# Patient Record
Sex: Female | Born: 1980 | Race: Black or African American | Hispanic: No | Marital: Single | Smoking: Never smoker
Health system: Southern US, Community
[De-identification: ages and names within clinical notes are randomized; demographics above are authoritative.]

---

## 2006-12-06 ENCOUNTER — Other Ambulatory Visit: Admission: RE | Admit: 2006-12-06 | Discharge: 2006-12-06 | Payer: Self-pay | Admitting: Obstetrics and Gynecology

## 2007-01-29 ENCOUNTER — Ambulatory Visit (HOSPITAL_COMMUNITY): Admission: RE | Admit: 2007-01-29 | Discharge: 2007-01-29 | Payer: Self-pay | Admitting: Obstetrics and Gynecology

## 2007-04-29 ENCOUNTER — Inpatient Hospital Stay (HOSPITAL_COMMUNITY): Admission: AD | Admit: 2007-04-29 | Discharge: 2007-04-30 | Payer: Self-pay | Admitting: Obstetrics and Gynecology

## 2010-02-25 ENCOUNTER — Ambulatory Visit (HOSPITAL_COMMUNITY): Admission: RE | Admit: 2010-02-25 | Discharge: 2010-02-25 | Payer: Self-pay | Admitting: Obstetrics and Gynecology

## 2010-07-07 ENCOUNTER — Inpatient Hospital Stay (HOSPITAL_COMMUNITY): Admission: RE | Admit: 2010-07-07 | Discharge: 2010-07-10 | Payer: Self-pay | Admitting: Obstetrics & Gynecology

## 2011-02-11 LAB — CBC
HCT: 33 % — ABNORMAL LOW (ref 36.0–46.0)
HCT: 35.8 % — ABNORMAL LOW (ref 36.0–46.0)
Hemoglobin: 11.2 g/dL — ABNORMAL LOW (ref 12.0–15.0)
MCH: 30 pg (ref 26.0–34.0)
MCH: 30.3 pg (ref 26.0–34.0)
MCV: 88.8 fL (ref 78.0–100.0)
MCV: 89.2 fL (ref 78.0–100.0)
MCV: 89.4 fL (ref 78.0–100.0)
Platelets: 75 10*3/uL — ABNORMAL LOW (ref 150–400)
RDW: 13.5 % (ref 11.5–15.5)
RDW: 13.6 % (ref 11.5–15.5)
WBC: 5.9 10*3/uL (ref 4.0–10.5)
WBC: 6.6 10*3/uL (ref 4.0–10.5)

## 2011-02-11 LAB — CCBB MATERNAL DONOR DRAW

## 2011-02-11 LAB — SURGICAL PCR SCREEN: Staphylococcus aureus: NEGATIVE

## 2011-02-11 LAB — RPR: RPR Ser Ql: NONREACTIVE

## 2011-09-15 LAB — URINALYSIS, ROUTINE W REFLEX MICROSCOPIC
Specific Gravity, Urine: 1.01
pH: 6

## 2012-04-18 ENCOUNTER — Emergency Department (HOSPITAL_COMMUNITY)
Admission: EM | Admit: 2012-04-18 | Discharge: 2012-04-18 | Disposition: A | Discharge status: Home / Self Care | Payer: No Typology Code available for payment source | Attending: Emergency Medicine | Admitting: Emergency Medicine

## 2012-04-18 ENCOUNTER — Encounter (HOSPITAL_COMMUNITY): Payer: Self-pay | Admitting: *Deleted

## 2012-04-18 DIAGNOSIS — M549 Dorsalgia, unspecified: Secondary | ICD-10-CM | POA: Insufficient documentation
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
## 2012-04-18 NOTE — ED Notes (Signed)
Pt reports that she need to leave because she must be home by 1230 for kids. Pt leaving ama.

## 2012-04-19 ENCOUNTER — Emergency Department (HOSPITAL_COMMUNITY)
Admission: EM | Admit: 2012-04-19 | Discharge: 2012-04-19 | Disposition: A | Discharge status: Home Health | Payer: No Typology Code available for payment source | Attending: Emergency Medicine | Admitting: Emergency Medicine

## 2012-04-19 ENCOUNTER — Encounter (HOSPITAL_COMMUNITY): Payer: Self-pay

## 2012-04-19 DIAGNOSIS — M545 Low back pain, unspecified: Secondary | ICD-10-CM | POA: Insufficient documentation

## 2012-04-19 DIAGNOSIS — S39012A Strain of muscle, fascia and tendon of lower back, initial encounter: Secondary | ICD-10-CM

## 2012-04-19 DIAGNOSIS — S335XXA Sprain of ligaments of lumbar spine, initial encounter: Secondary | ICD-10-CM | POA: Insufficient documentation

## 2012-04-19 MED ORDER — TRAMADOL HCL 50 MG PO TABS: 50.0000 mg | ORAL_TABLET | Freq: Four times a day (QID) | ORAL | Status: AC | PRN

## 2012-04-19 MED ORDER — NAPROXEN 500 MG PO TABS: 500.0000 mg | ORAL_TABLET | Freq: Two times a day (BID) | ORAL | Status: AC

## 2012-04-19 MED ORDER — IBUPROFEN 800 MG PO TABS
800.0000 mg | ORAL_TABLET | Freq: Once | ORAL | Status: AC
  Administered 2012-04-19: 800 mg via ORAL
  Filled 2012-04-19: qty 1
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
## 2012-04-19 NOTE — Discharge Instructions (Signed)
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
## 2015-12-24 ENCOUNTER — Ambulatory Visit: Payer: Medicaid Other | Admitting: Family Medicine

## 2016-01-06 ENCOUNTER — Ambulatory Visit: Payer: Medicaid Other | Attending: Family Medicine | Admitting: Family Medicine

## 2016-01-06 ENCOUNTER — Encounter: Payer: Self-pay | Admitting: Family Medicine

## 2016-01-06 VITALS — BP 116/83 | HR 66 | Temp 98.3°F | Resp 14 | Ht 61.0 in | Wt 122.8 lb

## 2016-01-06 DIAGNOSIS — Z0000 Encounter for general adult medical examination without abnormal findings: Secondary | ICD-10-CM | POA: Diagnosis not present

## 2016-01-06 DIAGNOSIS — K0889 Other specified disorders of teeth and supporting structures: Secondary | ICD-10-CM | POA: Diagnosis not present

## 2016-01-06 DIAGNOSIS — Z131 Encounter for screening for diabetes mellitus: Secondary | ICD-10-CM | POA: Diagnosis not present

## 2016-01-06 DIAGNOSIS — Z79899 Other long term (current) drug therapy: Secondary | ICD-10-CM | POA: Insufficient documentation

## 2016-01-06 DIAGNOSIS — E118 Type 2 diabetes mellitus with unspecified complications: Secondary | ICD-10-CM | POA: Insufficient documentation

## 2016-01-06 LAB — POCT GLYCOSYLATED HEMOGLOBIN (HGB A1C): Hemoglobin A1C: 5.4

## 2016-01-06 MED ORDER — OXYCODONE-ACETAMINOPHEN 5-325 MG PO TABS: 1.0000 | ORAL_TABLET | Freq: Three times a day (TID) | ORAL | Status: DC | PRN
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
## 2016-01-06 NOTE — Progress Notes (Signed)
Patient here to establish care She is currently having her menstrual cycle Under care of her dentist for tooth infection

## 2016-05-23 ENCOUNTER — Other Ambulatory Visit (HOSPITAL_COMMUNITY)
Admission: RE | Admit: 2016-05-23 | Discharge: 2016-05-23 | Disposition: A | Discharge status: Home / Self Care | Payer: Medicaid Other | Source: Ambulatory Visit | Attending: Family Medicine | Admitting: Family Medicine

## 2016-05-23 ENCOUNTER — Ambulatory Visit: Payer: Medicaid Other | Attending: Family Medicine | Admitting: Family Medicine

## 2016-05-23 ENCOUNTER — Encounter: Payer: Self-pay | Admitting: Family Medicine

## 2016-05-23 VITALS — BP 111/74 | HR 72 | Temp 98.9°F | Resp 14 | Ht 65.0 in | Wt 115.6 lb

## 2016-05-23 DIAGNOSIS — M546 Pain in thoracic spine: Secondary | ICD-10-CM | POA: Insufficient documentation

## 2016-05-23 DIAGNOSIS — Z13228 Encounter for screening for other metabolic disorders: Secondary | ICD-10-CM | POA: Diagnosis not present

## 2016-05-23 DIAGNOSIS — Z01411 Encounter for gynecological examination (general) (routine) with abnormal findings: Secondary | ICD-10-CM | POA: Insufficient documentation

## 2016-05-23 DIAGNOSIS — Z0000 Encounter for general adult medical examination without abnormal findings: Secondary | ICD-10-CM | POA: Insufficient documentation

## 2016-05-23 DIAGNOSIS — Z113 Encounter for screening for infections with a predominantly sexual mode of transmission: Secondary | ICD-10-CM | POA: Insufficient documentation

## 2016-05-23 DIAGNOSIS — Z124 Encounter for screening for malignant neoplasm of cervix: Secondary | ICD-10-CM | POA: Diagnosis not present

## 2016-05-23 DIAGNOSIS — Z79899 Other long term (current) drug therapy: Secondary | ICD-10-CM | POA: Diagnosis not present

## 2016-05-23 DIAGNOSIS — Z1151 Encounter for screening for human papillomavirus (HPV): Secondary | ICD-10-CM | POA: Insufficient documentation

## 2016-05-23 DIAGNOSIS — Z23 Encounter for immunization: Secondary | ICD-10-CM | POA: Diagnosis not present

## 2016-05-23 LAB — HEMOGLOBIN A1C
HEMOGLOBIN A1C: 5.6 % (ref <5.7)
MEAN PLASMA GLUCOSE: 114 mg/dL

## 2016-05-23 LAB — COMPLETE METABOLIC PANEL WITH GFR
ALBUMIN: 4.2 g/dL (ref 3.6–5.1)
ALK PHOS: 38 U/L (ref 33–115)
ALT: 9 U/L (ref 6–29)
AST: 14 U/L (ref 10–30)
BUN: 6 mg/dL — ABNORMAL LOW (ref 7–25)
CALCIUM: 8.8 mg/dL (ref 8.6–10.2)
CO2: 24 mmol/L (ref 20–31)
Chloride: 104 mmol/L (ref 98–110)
Creat: 0.59 mg/dL (ref 0.50–1.10)
Glucose, Bld: 83 mg/dL (ref 65–99)
POTASSIUM: 3.6 mmol/L (ref 3.5–5.3)
Sodium: 136 mmol/L (ref 135–146)
Total Bilirubin: 0.5 mg/dL (ref 0.2–1.2)
Total Protein: 6.9 g/dL (ref 6.1–8.1)

## 2016-05-23 LAB — LIPID PANEL
CHOL/HDL RATIO: 2.2 ratio (ref <=5.0)
CHOLESTEROL: 242 mg/dL — AB (ref 125–200)
HDL: 110 mg/dL (ref >=46)
LDL Cholesterol: 121 mg/dL (ref <130)
Triglycerides: 53 mg/dL (ref <150)
VLDL: 11 mg/dL (ref <30)

## 2016-05-23 NOTE — Progress Notes (Signed)
Pt here for blood work and physical. Pt does not take any medications.

## 2016-05-23 NOTE — Progress Notes (Signed)
Subjective:  Patient ID: Amy Ruiz, female    DOB: 05/26/1981  Age: 35 y.o. MRN: [id number]  CC: Annual Exam   HPI Amy Ruiz presents for a complete physical exam. She has mild thoracic back pain after she took a fall 6 weeks ago and is not taking anything for it. Pain does not radiate and she has been advised to apply heat and take OTC Ibuprofen.  She lost her dad 2 months ago and is also stressed due to the fact that her son is receiving chemotherapy.  Outpatient Prescriptions Prior to Visit  Medication Sig Dispense Refill  . amoxicillin (AMOXIL) 500 MG capsule Take 500 mg by mouth 2 (two) times daily. Reported on 05/23/2016    . Cyanocobalamin (VITAMIN B 12 PO) Take 1 tablet by mouth daily. Reported on 05/23/2016    . oxyCODONE-acetaminophen (ROXICET) 5-325 MG tablet Take 1 tablet by mouth every 8 (eight) hours as needed for severe pain. (Patient not taking: Reported on 05/23/2016) 20 tablet 0   No facility-administered medications prior to visit.    ROS Review of Systems  Constitutional: Negative for activity change, appetite change and fatigue.  HENT: Negative for congestion, sinus pressure and sore throat.   Eyes: Negative for visual disturbance.  Respiratory: Negative for cough, chest tightness, shortness of breath and wheezing.   Cardiovascular: Negative for chest pain and palpitations.  Gastrointestinal: Negative for abdominal pain, constipation and abdominal distention.  Endocrine: Negative for polydipsia.  Genitourinary: Negative for dysuria and frequency.  Musculoskeletal: Negative for back pain and arthralgias.  Skin: Negative for rash.  Neurological: Negative for tremors, light-headedness and numbness.  Hematological: Does not bruise/bleed easily.  Psychiatric/Behavioral: Negative for behavioral problems and agitation.    Objective:  BP 111/74 mmHg  Pulse 72  Temp(Src) 98.9 F (37.2 C) (Oral)  Resp 14  Ht 5\' 5"  (1.651 m)  Wt 115 lb 9.6 oz (52.436  kg)  BMI 19.24 kg/m2  SpO2 100%  BP/Weight 05/23/2016 01/06/2016 04/19/2012  Systolic BP 111 116 104  Diastolic BP 74 83 57  Wt. (Lbs) 115.6 122.8 -  BMI 19.24 23.21 -      Physical Exam  Constitutional: She is oriented to person, place, and time. She appears well-developed and well-nourished.  Cardiovascular: Normal rate, normal heart sounds and intact distal pulses.   No murmur heard. Pulmonary/Chest: Effort normal and breath sounds normal. She has no wheezes. She has no rales. She exhibits no tenderness. Right breast exhibits no mass (nipple ring present) and no tenderness. Left breast exhibits no mass (nipple ring present) and no tenderness.  Abdominal: Soft. Bowel sounds are normal. She exhibits no distension and no mass. There is no tenderness.  Genitourinary: Vagina normal and uterus normal.  Musculoskeletal: Normal range of motion. She exhibits tenderness (mild tenderness of palpation of mid back ).  Neurological: She is alert and oriented to person, place, and time.  Psychiatric: She has a normal mood and affect.     Assessment & Plan:   1. Screening for metabolic disorder _Lipid panel - COMPLETE METABOLIC PANEL WITH GFR - HgB [id number]  2. Screening for cervical cancer - Cytology - PAP ([place])  3. Screening for STDs (sexually transmitted diseases) - HIV antibody  4. Need for Tdap vaccination - Tdap vaccine greater than or equal to 7yo IM  5. Routine general medical examination at a health care facility  No orders of the defined types were placed in this encounter.    Follow-up: Return in  1 year (on 05/23/2017) for complete physical exam.   Amy ShaggyEnobong Amao MD

## 2016-05-23 NOTE — Patient Instructions (Addendum)
Health Maintenance, Female Adopting a healthy lifestyle and getting preventive care can go a long way to promote health and wellness. Talk with your health care provider about what schedule of regular examinations is right for you. This is a good chance for you to check in with your provider about disease prevention and staying healthy. In between checkups, there are plenty of things you can do on your own. Experts have done a lot of research about which lifestyle changes and preventive measures are most likely to keep you healthy. Ask your health care provider for more information. WEIGHT AND DIET  Eat a healthy diet  Be sure to include plenty of vegetables, fruits, low-fat dairy products, and lean protein.  Do not eat a lot of foods high in solid fats, added sugars, or salt.  Get regular exercise. This is one of the most important things you can do for your health.  Most adults should exercise for at least 150 minutes each week. The exercise should increase your heart rate and make you sweat (moderate-intensity exercise).  Most adults should also do strengthening exercises at least twice a week. This is in addition to the moderate-intensity exercise.  Maintain a healthy weight  Body mass index (BMI) is a measurement that can be used to identify possible weight problems. It estimates body fat based on height and weight. Your health care provider can help determine your BMI and help you achieve or maintain a healthy weight.  For females 28 years of age and older:   A BMI below 18.5 is considered underweight.  A BMI of 18.5 to 24.9 is normal.  A BMI of 25 to 29.9 is considered overweight.  A BMI of 30 and above is considered obese.  Watch levels of cholesterol and blood lipids  You should start having your blood tested for lipids and cholesterol at 36 years of age, then have this test every 5 years.  You may need to have your cholesterol levels checked more often if:  Your lipid  or cholesterol levels are high.  You are older than 36 years of age.  You are at high risk for heart disease.  CANCER SCREENING   Lung Cancer  Lung cancer screening is recommended for adults 75-66 years old who are at high risk for lung cancer because of a history of smoking.  A yearly low-dose CT scan of the lungs is recommended for people who:  Currently smoke.  Have quit within the past 15 years.  Have at least a 30-pack-year history of smoking. A pack year is smoking an average of one pack of cigarettes a day for 1 year.  Yearly screening should continue until it has been 15 years since you quit.  Yearly screening should stop if you develop a health problem that would prevent you from having lung cancer treatment.  Breast Cancer  Practice breast self-awareness. This means understanding how your breasts normally appear and feel.  It also means doing regular breast self-exams. Let your health care provider know about any changes, no matter how small.  If you are in your 20s or 30s, you should have a clinical breast exam (CBE) by a health care provider every 1-3 years as part of a regular health exam.  If you are 25 or older, have a CBE every year. Also consider having a breast X-ray (mammogram) every year.  If you have a family history of breast cancer, talk to your health care provider about genetic screening.  If you  are at high risk for breast cancer, talk to your health care provider about having an MRI and a mammogram every year.  Breast cancer gene (BRCA) assessment is recommended for women who have family members with BRCA-related cancers. BRCA-related cancers include:  Breast.  Ovarian.  Tubal.  Peritoneal cancers.  Results of the assessment will determine the need for genetic counseling and BRCA1 and BRCA2 testing. Cervical Cancer Your health care provider may recommend that you be screened regularly for cancer of the pelvic organs (ovaries, uterus, and  vagina). This screening involves a pelvic examination, including checking for microscopic changes to the surface of your cervix (Pap test). You may be encouraged to have this screening done every 3 years, beginning at age 21.  For women ages 30-65, health care providers may recommend pelvic exams and Pap testing every 3 years, or they may recommend the Pap and pelvic exam, combined with testing for human papilloma virus (HPV), every 5 years. Some types of HPV increase your risk of cervical cancer. Testing for HPV may also be done on women of any age with unclear Pap test results.  Other health care providers may not recommend any screening for nonpregnant women who are considered low risk for pelvic cancer and who do not have symptoms. Ask your health care provider if a screening pelvic exam is right for you.  If you have had past treatment for cervical cancer or a condition that could lead to cancer, you need Pap tests and screening for cancer for at least 20 years after your treatment. If Pap tests have been discontinued, your risk factors (such as having a new sexual partner) need to be reassessed to determine if screening should resume. Some women have medical problems that increase the chance of getting cervical cancer. In these cases, your health care provider may recommend more frequent screening and Pap tests. Colorectal Cancer  This type of cancer can be detected and often prevented.  Routine colorectal cancer screening usually begins at 36 years of age and continues through 36 years of age.  Your health care provider may recommend screening at an earlier age if you have risk factors for colon cancer.  Your health care provider may also recommend using home test kits to check for hidden blood in the stool.  A small camera at the end of a tube can be used to examine your colon directly (sigmoidoscopy or colonoscopy). This is done to check for the earliest forms of colorectal  cancer.  Routine screening usually begins at age 50.  Direct examination of the colon should be repeated every 5-10 years through 36 years of age. However, you may need to be screened more often if early forms of precancerous polyps or small growths are found. Skin Cancer  Check your skin from head to toe regularly.  Tell your health care provider about any new moles or changes in moles, especially if there is a change in a mole's shape or color.  Also tell your health care provider if you have a mole that is larger than the size of a pencil eraser.  Always use sunscreen. Apply sunscreen liberally and repeatedly throughout the day.  Protect yourself by wearing long sleeves, pants, a wide-brimmed hat, and sunglasses whenever you are outside. HEART DISEASE, DIABETES, AND HIGH BLOOD PRESSURE   High blood pressure causes heart disease and increases the risk of stroke. High blood pressure is more likely to develop in:  People who have blood pressure in the high end   of the normal range (130-139/85-89 mm Hg).  People who are overweight or obese.  People who are African American.  If you are 38-23 years of age, have your blood pressure checked every 3-5 years. If you are 61 years of age or older, have your blood pressure checked every year. You should have your blood pressure measured twice--once when you are at a hospital or clinic, and once when you are not at a hospital or clinic. Record the average of the two measurements. To check your blood pressure when you are not at a hospital or clinic, you can use:  An automated blood pressure machine at a pharmacy.  A home blood pressure monitor.  If you are between 45 years and 39 years old, ask your health care provider if you should take aspirin to prevent strokes.  Have regular diabetes screenings. This involves taking a blood sample to check your fasting blood sugar level.  If you are at a normal weight and have a low risk for diabetes,  have this test once every three years after 36 years of age.  If you are overweight and have a high risk for diabetes, consider being tested at a younger age or more often. PREVENTING INFECTION  Hepatitis B  If you have a higher risk for hepatitis B, you should be screened for this virus. You are considered at high risk for hepatitis B if:  You were born in a country where hepatitis B is common. Ask your health care provider which countries are considered high risk.  Your parents were born in a high-risk country, and you have not been immunized against hepatitis B (hepatitis B vaccine).  You have HIV or AIDS.  You use needles to inject street drugs.  You live with someone who has hepatitis B.  You have had sex with someone who has hepatitis B.  You get hemodialysis treatment.  You take certain medicines for conditions, including cancer, organ transplantation, and autoimmune conditions. Hepatitis C  Blood testing is recommended for:  Everyone born from [birth year] through 1966.  Anyone with known risk factors for hepatitis C. Sexually transmitted infections (STIs)  You should be screened for sexually transmitted infections (STIs) including gonorrhea and chlamydia if:  You are sexually active and are younger than 36 years of age.  You are older than 36 years of age and your health care provider tells you that you are at risk for this type of infection.  Your sexual activity has changed since you were last screened and you are at an increased risk for chlamydia or gonorrhea. Ask your health care provider if you are at risk.  If you do not have HIV, but are at risk, it may be recommended that you take a prescription medicine daily to prevent HIV infection. This is called pre-exposure prophylaxis (PrEP). You are considered at risk if:  You are sexually active and do not regularly use condoms or know the HIV status of your partner(s).  You take drugs by injection.  You are sexually  active with a partner who has HIV. Talk with your health care provider about whether you are at high risk of being infected with HIV. If you choose to begin PrEP, you should first be tested for HIV. You should then be tested every 3 months for as long as you are taking PrEP.  PREGNANCY   If you are premenopausal and you may become pregnant, ask your health care provider about preconception counseling.  If you may  become pregnant, take 400 to 800 micrograms (mcg) of folic acid every day.  If you want to prevent pregnancy, talk to your health care provider about birth control (contraception). OSTEOPOROSIS AND MENOPAUSE   Osteoporosis is a disease in which the bones lose minerals and strength with aging. This can result in serious bone fractures. Your risk for osteoporosis can be identified using a bone density scan.  If you are 65 years of age or older, or if you are at risk for osteoporosis and fractures, ask your health care provider if you should be screened.  Ask your health care provider whether you should take a calcium or vitamin D supplement to lower your risk for osteoporosis.  Menopause may have certain physical symptoms and risks.  Hormone replacement therapy may reduce some of these symptoms and risks. Talk to your health care provider about whether hormone replacement therapy is right for you.  HOME CARE INSTRUCTIONS   Schedule regular health, dental, and eye exams.  Stay current with your immunizations.   Do not use any tobacco products including cigarettes, chewing tobacco, or electronic cigarettes.  If you are pregnant, do not drink alcohol.  If you are breastfeeding, limit how much and how often you drink alcohol.  Limit alcohol intake to no more than 1 drink per day for nonpregnant women. One drink equals 12 ounces of beer, 5 ounces of wine, or 1 ounces of hard liquor.  Do not use street drugs.  Do not share needles.  Ask your health care provider for help if  you need support or information about quitting drugs.  Tell your health care provider if you often feel depressed.  Tell your health care provider if you have ever been abused or do not feel safe at home.   This information is not intended to replace advice given to you by your health care provider. Make sure you discuss any questions you have with your health care provider.   Document Released: 05/30/2011 Document Revised: 12/05/2014 Document Reviewed: 10/16/2013 Elsevier Interactive Patient Education 2016 [employer] Tdap Vaccine (Tetanus, Diphtheria and Pertussis): What You Need to Know 1. Why get vaccinated? Tetanus, diphtheria and pertussis are very serious diseases. Tdap vaccine can protect us from these diseases. And, Tdap vaccine given to pregnant women can protect newborn babies against pertussis. TETANUS (Lockjaw) is rare in the [place] today. It causes painful muscle tightening and stiffness, usually all over the body.  It can lead to tightening of muscles in the head and neck so you can't open your mouth, swallow, or sometimes even breathe. Tetanus kills about 1 out of 10 people who are infected even after receiving the best medical care. DIPHTHERIA is also rare in the [place] today. It can cause a thick coating to form in the back of the throat.  It can lead to breathing problems, heart failure, paralysis, and death. PERTUSSIS (Whooping Cough) causes severe coughing spells, which can cause difficulty breathing, vomiting and disturbed sleep.  It can also lead to weight loss, incontinence, and rib fractures. Up to 2 in 100 adolescents and 5 in 100 adults with pertussis are hospitalized or have complications, which could include pneumonia or death. These diseases are caused by bacteria. Diphtheria and pertussis are spread from person to person through secretions from coughing or sneezing. Tetanus enters the body through cuts, scratches, or wounds. Before vaccines, as  many as 200,000 cases of diphtheria, 200,000 cases of pertussis, and hundreds of cases of tetanus, were reported in   the [place] each year. Since vaccination began, reports of cases for tetanus and diphtheria have dropped by about 99% and for pertussis by about 80%. 2. Tdap vaccine Tdap vaccine can protect adolescents and adults from tetanus, diphtheria, and pertussis. One dose of Tdap is routinely given at age 11 or 12. People who did not get Tdap at that age should get it as soon as possible. Tdap is especially important for healthcare professionals and anyone having close contact with a baby younger than 12 months. Pregnant women should get a dose of Tdap during every pregnancy, to protect the newborn from pertussis. Infants are most at risk for severe, life-threatening complications from pertussis. Another vaccine, called Td, protects against tetanus and diphtheria, but not pertussis. A Td booster should be given every 10 years. Tdap may be given as one of these boosters if you have never gotten Tdap before. Tdap may also be given after a severe cut or burn to prevent tetanus infection. Your doctor or the person giving you the vaccine can give you more information. Tdap may safely be given at the same time as other vaccines. 3. Some people should not get this vaccine  A person who has ever had a life-threatening allergic reaction after a previous dose of any diphtheria, tetanus or pertussis containing vaccine, OR has a severe allergy to any part of this vaccine, should not get Tdap vaccine. Tell the person giving the vaccine about any severe allergies.  Anyone who had coma or long repeated seizures within 7 days after a childhood dose of DTP or DTaP, or a previous dose of Tdap, should not get Tdap, unless a cause other than the vaccine was found. They can still get Td.  Talk to your doctor if you:  have seizures or another nervous system problem,  had severe pain or swelling after any  vaccine containing diphtheria, tetanus or pertussis,  ever had a condition called Guillain-Barr Syndrome (GBS),  aren't feeling well on the day the shot is scheduled. 4. Risks With any medicine, including vaccines, there is a chance of side effects. These are usually mild and go away on their own. Serious reactions are also possible but are rare. Most people who get Tdap vaccine do not have any problems with it. Mild problems following Tdap (Did not interfere with activities)  Pain where the shot was given (about 3 in 4 adolescents or 2 in 3 adults)  Redness or swelling where the shot was given (about 1 person in 5)  Mild fever of at least 100.4F (up to about 1 in 25 adolescents or 1 in 100 adults)  Headache (about 3 or 4 people in 10)  Tiredness (about 1 person in 3 or 4)  Nausea, vomiting, diarrhea, stomach ache (up to 1 in 4 adolescents or 1 in 10 adults)  Chills, sore joints (about 1 person in 10)  Body aches (about 1 person in 3 or 4)  Rash, swollen glands (uncommon) Moderate problems following Tdap (Interfered with activities, but did not require medical attention)  Pain where the shot was given (up to 1 in 5 or 6)  Redness or swelling where the shot was given (up to about 1 in 16 adolescents or 1 in 12 adults)  Fever over 102F (about 1 in 100 adolescents or 1 in 250 adults)  Headache (about 1 in 7 adolescents or 1 in 10 adults)  Nausea, vomiting, diarrhea, stomach ache (up to 1 or 3 people in 100)  Swelling of the   entire arm where the shot was given (up to about 1 in 500). Severe problems following Tdap (Unable to perform usual activities; required medical attention)  Swelling, severe pain, bleeding and redness in the arm where the shot was given (rare). Problems that could happen after any vaccine:  People sometimes faint after a medical procedure, including vaccination. Sitting or lying down for about 15 minutes can help prevent fainting, and injuries  caused by a fall. Tell your doctor if you feel dizzy, or have vision changes or ringing in the ears.  Some people get severe pain in the shoulder and have difficulty moving the arm where a shot was given. This happens very rarely.  Any medication can cause a severe allergic reaction. Such reactions from a vaccine are very rare, estimated at fewer than 1 in a million doses, and would happen within a few minutes to a few hours after the vaccination. As with any medicine, there is a very remote chance of a vaccine causing a serious injury or death. The safety of vaccines is always being monitored. For more information, visit: www.cdc.gov/vaccinesafety/ 5. What if there is a serious problem? What should I look for?  Look for anything that concerns you, such as signs of a severe allergic reaction, very high fever, or unusual behavior.  Signs of a severe allergic reaction can include hives, swelling of the face and throat, difficulty breathing, a fast heartbeat, dizziness, and weakness. These would usually start a few minutes to a few hours after the vaccination. What should I do?  If you think it is a severe allergic reaction or other emergency that can't wait, call 9-1-1 or get the person to the nearest hospital. Otherwise, call your doctor.  Afterward, the reaction should be reported to the Vaccine Adverse Event Reporting System (VAERS). Your doctor might file this report, or you can do it yourself through the VAERS web site at www.vaers.hhs.gov, or by calling 1-800-822-7967. VAERS does not give medical advice.  6. The National Vaccine Injury Compensation Program The National Vaccine Injury Compensation Program (VICP) is a federal program that was created to compensate people who may have been injured by certain vaccines. Persons who believe they may have been injured by a vaccine can learn about the program and about filing a claim by calling 1-800-338-2382 or visiting the VICP website at  www.hrsa.gov/vaccinecompensation. There is a time limit to file a claim for compensation. 7. How can I learn more?  Ask your doctor. He or she can give you the vaccine package insert or suggest other sources of information.  Call your local or state health department.  Contact the Centers for Disease Control and Prevention (CDC):  Call 1-800-232-4636 (1-800-CDC-INFO) or  Visit CDC's website at www.cdc.gov/vaccines CDC Tdap Vaccine VIS (01/21/14)   This information is not intended to replace advice given to you by your health care provider. Make sure you discuss any questions you have with your health care provider.   Document Released: 05/15/2012 Document Revised: 12/05/2014 Document Reviewed: 02/26/2014 Elsevier Interactive Patient Education 2016 Elsevier Inc.  

## 2016-05-24 LAB — CYTOLOGY - PAP

## 2016-05-24 LAB — CERVICOVAGINAL ANCILLARY ONLY: WET PREP (BD AFFIRM): POSITIVE — AB

## 2016-05-24 LAB — HIV ANTIBODY (ROUTINE TESTING W REFLEX): HIV: NONREACTIVE

## 2016-05-25 ENCOUNTER — Telehealth: Payer: Self-pay

## 2016-05-25 ENCOUNTER — Other Ambulatory Visit: Payer: Self-pay | Admitting: Family Medicine

## 2016-05-25 DIAGNOSIS — B3731 Acute candidiasis of vulva and vagina: Secondary | ICD-10-CM

## 2016-05-25 DIAGNOSIS — B373 Candidiasis of vulva and vagina: Secondary | ICD-10-CM

## 2016-05-25 DIAGNOSIS — B9689 Other specified bacterial agents as the cause of diseases classified elsewhere: Secondary | ICD-10-CM

## 2016-05-25 DIAGNOSIS — N760 Acute vaginitis: Secondary | ICD-10-CM

## 2016-05-25 MED ORDER — METRONIDAZOLE 0.75 % VA GEL: 1.0000 | Freq: Every day | VAGINAL | Status: DC

## 2016-05-25 MED ORDER — FLUCONAZOLE 150 MG PO TABS: 150.0000 mg | ORAL_TABLET | Freq: Once | ORAL | Status: DC

## 2016-05-25 NOTE — Telephone Encounter (Signed)
-----   Message from Jaclyn ShaggyEnobong Amao, MD sent at 05/25/2016  1:50 PM EDT ----- Labs negative for HIV, diabetes, cholesterol is mildly elevated and she has been advised to work on low-cholesterol diet and exercise. Vaginal cultures revealed bacterial vaginosis and Candida which are not STDs- I had prescribed MetroGel and Diflucan but was unable to send electronically due to inactivity pharmacy on file. Please obtain her pharmacy and sent prescriptions. Pap smear showed ASCUS, HPV negative will repeat in one year.

## 2016-05-25 NOTE — Telephone Encounter (Signed)
Called patient today and explained her lab results.  Patient stated understanding and provided a pharmacy which writer entered into the chart.  Both prescriptions have been faxed to the [facility] pharmacy.  Patient to pick up prescriptions today and begin medications .

## 2016-05-25 NOTE — Telephone Encounter (Signed)
-----   Message from Enobong Amao, MD sent at 05/25/2016  1:50 PM EDT ----- Labs negative for HIV, diabetes, cholesterol is mildly elevated and she has been advised to work on low-cholesterol diet and exercise. Vaginal cultures revealed bacterial vaginosis and Candida which are not STDs- I had prescribed MetroGel and Diflucan but was unable to send electronically due to inactivity pharmacy on file. Please obtain her pharmacy and sent prescriptions. Pap smear showed ASCUS, HPV negative will repeat in one year. 

## 2016-06-12 ENCOUNTER — Encounter (HOSPITAL_COMMUNITY): Payer: Self-pay | Admitting: Oncology

## 2016-06-12 ENCOUNTER — Emergency Department (HOSPITAL_COMMUNITY)
Admission: EM | Admit: 2016-06-12 | Discharge: 2016-06-12 | Disposition: A | Discharge status: Home / Self Care | Payer: Medicaid Other | Attending: Emergency Medicine | Admitting: Emergency Medicine

## 2016-06-12 DIAGNOSIS — H9391 Unspecified disorder of right ear: Secondary | ICD-10-CM | POA: Insufficient documentation

## 2016-06-12 DIAGNOSIS — Y929 Unspecified place or not applicable: Secondary | ICD-10-CM | POA: Diagnosis not present

## 2016-06-12 DIAGNOSIS — Y999 Unspecified external cause status: Secondary | ICD-10-CM | POA: Insufficient documentation

## 2016-06-12 DIAGNOSIS — Y939 Activity, unspecified: Secondary | ICD-10-CM | POA: Diagnosis not present

## 2016-06-12 DIAGNOSIS — X58XXXA Exposure to other specified factors, initial encounter: Secondary | ICD-10-CM | POA: Insufficient documentation

## 2016-06-12 DIAGNOSIS — T161XXA Foreign body in right ear, initial encounter: Secondary | ICD-10-CM | POA: Diagnosis present

## 2016-06-12 DIAGNOSIS — H9201 Otalgia, right ear: Secondary | ICD-10-CM

## 2016-06-12 MED ORDER — ANTIPYRINE-BENZOCAINE 5.4-1.4 % OT SOLN
3.0000 [drp] | OTIC | Status: DC | PRN
Start: 2016-06-12 — End: 2017-02-17

## 2016-06-12 NOTE — ED Notes (Signed)
Pt was cleaning her right ear w/ a q tip when the cotton from the q-tip became lodged into pt's right ear.  Rates pain 6/10, pressure in nature.

## 2016-06-12 NOTE — Discharge Instructions (Signed)
There were no foreign bodies in your ear canal. Using benzocaine drops as needed for pain. Use naproxen or ibuprofen for any facial pain. Follow-up with your PCP should symptoms fail to resolve by tomorrow.

## 2016-06-12 NOTE — ED Provider Notes (Signed)
CSN: [id number]     Arrival date & time 06/12/16  1955 History  By signing my name below, I, Rosario AdieWilliam Andrew Hiatt, attest that this documentation has been prepared under the direction and in the presence of Moise Friday, PA-C.  Electronically Signed: Rosario AdieWilliam Andrew Hiatt, ED Scribe. 06/12/2016. 8:35 PM.   Chief Complaint  Patient presents with  . Foreign Body in Ear   The history is provided by the patient. No language interpreter was used.   HPI Comments: Amy Ruiz is a 35 y.o. female with no pertinent PMHx who presents to the Emergency Department complaining of sudden onset, unchanged, constant, 6/10 right ear pain s/p cleaning her ear with a q-tip PTA. Pt describes her pain as like a pressure in nature, and states "that it feels clogged up". Per pt, she reports that she was cleaning her ear when the cotton from the q-tip became dislodged in her ear. Her pain/pressure are mildly alleviated when keeps her head pressed to her right shoulder. No history of previous ear infections. She denies hearing loss, drainage, or any other symptoms.  History reviewed. No pertinent past medical history. History reviewed. No pertinent past surgical history. Family History  Problem Relation Age of Onset  . Hypertension Mother    Social History  Substance Use Topics  . Smoking status: Never Smoker   . Smokeless tobacco: None  . Alcohol Use: No   OB History    No data available     Review of Systems  Constitutional: Negative for fever.  HENT: Positive for ear pain (right). Negative for facial swelling and hearing loss.   Neurological: Negative for headaches.   Allergies  Review of patient's allergies indicates no known allergies.  Home Medications   Prior to Admission medications   Medication Sig Start Date End Date Taking? Authorizing Provider  amoxicillin (AMOXIL) 500 MG capsule Take 500 mg by mouth 2 (two) times daily. Reported on 05/23/2016    Historical Provider, MD   antipyrine-benzocaine Lyla Son(AURALGAN) otic solution Place 3-4 drops into the right ear every 2 (two) hours as needed for ear pain. 06/12/16   Lewin Pellow C Jahzara Slattery, PA-C  Cyanocobalamin (VITAMIN B 12 PO) Take 1 tablet by mouth daily. Reported on 05/23/2016    Historical Provider, MD  fluconazole (DIFLUCAN) 150 MG tablet Take 1 tablet (150 mg total) by mouth once. 05/25/16   Jaclyn ShaggyEnobong Amao, MD  metroNIDAZOLE (METROGEL VAGINAL) 0.75 % vaginal gel Place 1 Applicatorful vaginally at bedtime. 05/25/16   Jaclyn ShaggyEnobong Amao, MD  oxyCODONE-acetaminophen (ROXICET) 5-325 MG tablet Take 1 tablet by mouth every 8 (eight) hours as needed for severe pain. Patient not taking: Reported on 05/23/2016 01/06/16   Jaclyn ShaggyEnobong Amao, MD   BP 121/85 mmHg  Pulse 83  Temp(Src) 98.6 F (37 C) (Oral)  Resp 17  SpO2 100%  LMP 06/08/2016 (Approximate)   Physical Exam  Constitutional: She appears well-developed and well-nourished. No distress.  HENT:  Head: Normocephalic and atraumatic.  Right Ear: External ear normal.  Left Ear: External ear normal.  Both TMs were fully visualized, and were normal with clear canals bilaterally. Scant cerumen, but no foreign bodies visualized. Canals and TMs appear to be atraumatic.  Eyes: Conjunctivae are normal.  Neck: Normal range of motion. Neck supple.  Cardiovascular: Normal rate and regular rhythm.   Pulmonary/Chest: Effort normal. No respiratory distress.  Abdominal: She exhibits no distension.  Musculoskeletal: Normal range of motion.  Neurological: She is alert.  Skin: Skin is warm and dry. She is  not diaphoretic.  Psychiatric: She has a normal mood and affect. Her behavior is normal.  Nursing note and vitals reviewed.  ED Course  Procedures (including critical care time)  DIAGNOSTIC STUDIES: Oxygen Saturation is 100% on RA, normal by my interpretation.   COORDINATION OF CARE: 8:35 PM-Discussed next steps with pt including f/u w/ PCP if symptoms persist. Pt verbalized understanding and is  agreeable with the plan.    MDM   Final diagnoses:  Ear discomfort, right   Amy Ruiz present with A complaint of foreign body in her right ear.  No foreign body noted on exam. No damage to the canal or TM. Benzocaine drops and NSAIDs recommended. Ear hygiene discussed. Patient to follow up with PCP should symptoms continue tomorrow. The patient was given further instructions for home care as well as return precautions. Patient voices understanding of these instructions, accepts the plan, and is comfortable with discharge.  I personally performed the services described in this documentation, which was scribed in my presence. The recorded information has been reviewed and is accurate.   Anselm Pancoast, PA-C 06/12/16 2103  Tilden Fossa, MD 06/13/16 1452

## 2016-06-12 NOTE — ED Notes (Signed)
PT DISCHARGED. INSTRUCTIONS AND PRESCRIPTION GIVEN. AAOX4. PT IN NO APPARENT DISTRESS. THE OPPORTUNITY TO ASK QUESTIONS WAS PROVIDED. 

## 2017-02-13 ENCOUNTER — Telehealth: Payer: Self-pay | Admitting: Family Medicine

## 2017-02-13 DIAGNOSIS — N912 Amenorrhea, unspecified: Secondary | ICD-10-CM

## 2017-02-13 NOTE — Telephone Encounter (Signed)
Patient called the office to inform PCP that she is pregnant. [facility]. Please follow up. Pt has Medicaid.   Thank you.

## 2017-02-13 NOTE — Telephone Encounter (Signed)
Referral placed.

## 2017-02-13 NOTE — Telephone Encounter (Signed)
Writer called patient to let her know that the requested referral was placed by MD.  LVM that if she has any questions to call.

## 2017-02-17 ENCOUNTER — Ambulatory Visit (INDEPENDENT_AMBULATORY_CARE_PROVIDER_SITE_OTHER): Payer: Medicaid Other

## 2017-02-17 ENCOUNTER — Encounter: Payer: Self-pay | Admitting: General Practice

## 2017-02-17 DIAGNOSIS — Z3201 Encounter for pregnancy test, result positive: Secondary | ICD-10-CM | POA: Diagnosis not present

## 2017-02-17 LAB — POCT PREGNANCY, URINE: PREG TEST UR: POSITIVE — AB

## 2017-02-17 NOTE — Progress Notes (Signed)
Pt here today for pregnancy test.  Resulted positive.  Pt reports LMP approximately 01/02/17 with EDD 10/09/17.  Proof of pregnancy letter given to start prenatal care.

## 2017-03-14 ENCOUNTER — Emergency Department (HOSPITAL_COMMUNITY)
Admission: EM | Admit: 2017-03-14 | Discharge: 2017-03-14 | Disposition: A | Discharge status: Home / Self Care | Payer: Medicaid Other | Attending: Emergency Medicine | Admitting: Emergency Medicine

## 2017-03-14 ENCOUNTER — Encounter (HOSPITAL_COMMUNITY): Payer: Self-pay | Admitting: Emergency Medicine

## 2017-03-14 ENCOUNTER — Emergency Department (HOSPITAL_COMMUNITY): Payer: Medicaid Other

## 2017-03-14 DIAGNOSIS — O209 Hemorrhage in early pregnancy, unspecified: Secondary | ICD-10-CM | POA: Insufficient documentation

## 2017-03-14 DIAGNOSIS — Z79899 Other long term (current) drug therapy: Secondary | ICD-10-CM | POA: Insufficient documentation

## 2017-03-14 DIAGNOSIS — O4690 Antepartum hemorrhage, unspecified, unspecified trimester: Secondary | ICD-10-CM

## 2017-03-14 DIAGNOSIS — O0281 Inappropriate change in quantitative human chorionic gonadotropin (hCG) in early pregnancy: Secondary | ICD-10-CM | POA: Diagnosis not present

## 2017-03-14 DIAGNOSIS — Z3A10 10 weeks gestation of pregnancy: Secondary | ICD-10-CM | POA: Diagnosis not present

## 2017-03-14 DIAGNOSIS — N939 Abnormal uterine and vaginal bleeding, unspecified: Secondary | ICD-10-CM

## 2017-03-14 LAB — URINALYSIS, ROUTINE W REFLEX MICROSCOPIC
Bilirubin Urine: NEGATIVE
GLUCOSE, UA: NEGATIVE mg/dL
Ketones, ur: NEGATIVE mg/dL
Leukocytes, UA: NEGATIVE
Nitrite: NEGATIVE
PROTEIN: NEGATIVE mg/dL
Specific Gravity, Urine: 1.008 (ref 1.005–1.030)
pH: 5 (ref 5.0–8.0)

## 2017-03-14 LAB — HCG, QUANTITATIVE, PREGNANCY: hCG, Beta Chain, Quant, S: 21855 m[IU]/mL — ABNORMAL HIGH (ref <5)

## 2017-03-14 LAB — WET PREP, GENITAL
Clue Cells Wet Prep HPF POC: NONE SEEN
SPERM: NONE SEEN
Trich, Wet Prep: NONE SEEN
WBC WET PREP: NONE SEEN

## 2017-03-14 LAB — ABO/RH: ABO/RH(D): O POS

## 2017-03-14 MED ORDER — ACETAMINOPHEN 500 MG PO TABS
1000.0000 mg | ORAL_TABLET | Freq: Once | ORAL | Status: AC
  Administered 2017-03-14: 1000 mg via ORAL
  Filled 2017-03-14: qty 2

## 2017-03-14 NOTE — ED Triage Notes (Signed)
Pt [redacted] weeks pregnant. Pt with vaginal bleeding since last night. Pt reports one large clot at first and then continuous bleeding after, patient also c/o constant pelvic cramping.

## 2017-03-14 NOTE — ED Provider Notes (Signed)
[facility]-EMERGENCY DEPT Provider Note   CSN: [id number] Arrival date & time: 03/14/17  1046     History   Chief Complaint Chief Complaint  Patient presents with  . Vaginal Bleeding    HPI Amy Ruiz is a 36 y.o. female  [id number] who presents emergency Department with chief complaint of vaginal bleeding. Patient states that she is possibly [redacted] weeks pregnant. She has no predominant natal care up to this point and is scheduled for her first visit on the 30th of this coming month. Patient states that last night she had some lower abdominal cramping consistent with menstruation cramps. Then she began having vaginal bleeding. She had fairly heavy bleeding and then noticed that she passed a large clot last night. Her bleeding has slowed, but she continues to have frequent steady bleeding and cramping in her abdomen. Patient states she has a previous miscarriage when she was about 36 years old. She denies any urinary symptoms, back pain, nausea, vomiting or other symptoms. She denies any lightheadedness or shortness of breath.  HPI  History reviewed. No pertinent past medical history.  Patient Active Problem List   Diagnosis Date Noted  . Tooth ache 01/06/2016    History reviewed. No pertinent surgical history.  OB History    Gravida Para Term Preterm AB Living   1             SAB TAB Ectopic Multiple Live Births                   Home Medications    Prior to Admission medications   Medication Sig Start Date End Date Taking? Authorizing Provider  Prenatal Vit-Fe Fumarate-FA (PRENATAL MULTIVITAMIN) TABS tablet Take 1 tablet by mouth daily.   Yes Historical Provider, MD    Family History Family History  Problem Relation Age of Onset  . Hypertension Mother     Social History Social History  Substance Use Topics  . Smoking status: Never Smoker  . Smokeless tobacco: Never Used  . Alcohol use No     Allergies   Patient has no known allergies.   Review of  Systems Review of Systems  Ten systems reviewed and are negative for acute change, except as noted in the HPI.   Physical Exam Updated Vital Signs BP 110/65 (BP Location: Right Arm)   Pulse 78   Temp 98.9 F (37.2 C) (Oral)   Resp 12   LMP 06/08/2016 (Approximate)   SpO2 99%   Physical Exam  Constitutional: She is oriented to person, place, and time. She appears well-developed and well-nourished. No distress.  HENT:  Head: Normocephalic and atraumatic.  Eyes: Conjunctivae are normal. No scleral icterus.  Neck: Normal range of motion.  Cardiovascular: Normal rate, regular rhythm and normal heart sounds.  Exam reveals no gallop and no friction rub.   No murmur heard. Pulmonary/Chest: Effort normal and breath sounds normal. No respiratory distress.  Abdominal: Soft. Bowel sounds are normal. She exhibits no distension and no mass. There is no tenderness. There is no guarding.  Genitourinary:  Genitourinary Comments: IS dilated to approximately 1 cm, of plug of mucoid bloody tissue components was present in the os, which I removed with forceps. The rest of the tissue retracted into the cervix. The os is currently not as dilated. Upon entry the vaginal vault was full of blood and clot. However, this was removed with suction and no vaginal wall abnormalities were noted. There does not appear to be any cervical or  vaginal abnormal discharge. No cervical motion tenderness, adnexal tenderness or fullness.  Neurological: She is alert and oriented to person, place, and time.  Skin: Skin is warm and dry. She is not diaphoretic.  Psychiatric: Her behavior is normal.  Nursing note and vitals reviewed.    ED Treatments / Results  Labs (all labs ordered are listed, but only abnormal results are displayed) Labs Reviewed  WET PREP, GENITAL - Abnormal; Notable for the following:       Result Value   Yeast Wet Prep HPF POC PRESENT (*)    All other components within normal limits  URINALYSIS,  ROUTINE W REFLEX MICROSCOPIC - Abnormal; Notable for the following:    Hgb urine dipstick LARGE (*)    Bacteria, UA RARE (*)    Squamous Epithelial / LPF 0-5 (*)    All other components within normal limits  HCG, QUANTITATIVE, PREGNANCY - Abnormal; Notable for the following:    hCG, Beta Chain, Quant, S 21,855 (*)    All other components within normal limits  RPR  HIV ANTIBODY (ROUTINE TESTING)  ABO/RH  GC/CHLAMYDIA PROBE AMP ([facility]) NOT AT [facility]    EKG  EKG Interpretation None       Radiology US Ob Comp < 14 Wks  Result Date: 03/14/2017 CLINICAL DATA:  Vaginal bleeding for 2 days EXAM: OBSTETRIC <14 WK [place] AND TRANSVAGINAL OB US TECHNIQUE: Both transabdominal and transvaginal ultrasound examinations were performed for complete evaluation of the gestation as well as the maternal uterus, adnexal regions, and pelvic cul-de-sac. Transvaginal technique was performed to assess early pregnancy. COMPARISON:  None. FINDINGS: Intrauterine gestational sac: None Yolk sac:  Not Visualized. Embryo:  Not Visualized. Cardiac Activity: Not Visualized. Subchorionic hemorrhage:  None visualized. Maternal uterus/adnexae: Heterogeneous thickened endometrium measuring 20.5 mm. No adnexal mass. No pelvic free fluid. Left ovary measures 3.6 x 1.4 x 1.9 cm. Right ovary measures 3.4 x 1.4 x 2.5 cm. IMPRESSION: 1. No intrauterine gestation sac identified. Given the elevated beta HCG, differential diagnosis includes missed abortion versus ectopic pregnancy. Recommend clinical correlation, serial quantitative beta HCGs, ectopic precautions, and followup ultrasound as clinically indicated. Electronically Signed   By: Elige Ko   On: 03/14/2017 15:50   US Ob Transvaginal  Result Date: 03/14/2017 CLINICAL DATA:  Vaginal bleeding for 2 days EXAM: OBSTETRIC <14 WK [place] AND TRANSVAGINAL OB US TECHNIQUE: Both transabdominal and transvaginal ultrasound examinations were performed for complete evaluation of the  gestation as well as the maternal uterus, adnexal regions, and pelvic cul-de-sac. Transvaginal technique was performed to assess early pregnancy. COMPARISON:  None. FINDINGS: Intrauterine gestational sac: None Yolk sac:  Not Visualized. Embryo:  Not Visualized. Cardiac Activity: Not Visualized. Subchorionic hemorrhage:  None visualized. Maternal uterus/adnexae: Heterogeneous thickened endometrium measuring 20.5 mm. No adnexal mass. No pelvic free fluid. Left ovary measures 3.6 x 1.4 x 1.9 cm. Right ovary measures 3.4 x 1.4 x 2.5 cm. IMPRESSION: 1. No intrauterine gestation sac identified. Given the elevated beta HCG, differential diagnosis includes missed abortion versus ectopic pregnancy. Recommend clinical correlation, serial quantitative beta HCGs, ectopic precautions, and followup ultrasound as clinically indicated. Electronically Signed   By: Elige Ko   On: 03/14/2017 15:50    Procedures Procedures (including critical care time)  Medications Ordered in ED Medications  acetaminophen (TYLENOL) tablet 1,000 mg (1,000 mg Oral Given 03/14/17 1349)     Initial Impression / Assessment and Plan / ED Course  I have reviewed the triage vital signs and the nursing notes.  Pertinent labs & imaging results that were available during my care of the patient were reviewed by me and considered in my medical decision making (see chart for details).  Clinical Course as of Mar 14 1630  Tue Mar 14, 2017  1612 HCG, Newman Nickels 16,109 [AH]  6045 Patient is well-appearing. She denies any weakness or shortness of breath and her bleeding has resolved. I have discussed the findings with the patient who understands that most likely she has a missed abortion, however, agrees to follow-up with women's outpatient care on in 48 hours for repeat quantitative hCG to make sure she is trending downward. I have answered all questions to the best of my ability. I have discussed return precautions thoroughly with  the patient who agrees with plan of care.   [AH]    Clinical Course User Index [AH] Arthor Captain, PA-C      Final Clinical Impressions(s) / ED Diagnoses   Final diagnoses:  Vaginal bleeding  Vaginal bleeding in pregnancy    New Prescriptions New Prescriptions   No medications on file     Arthor Captain, PA-C 03/14/17 1631    Shaune Pollack, MD 03/15/17 [phone]

## 2017-03-14 NOTE — ED Notes (Signed)
Bed: [facility] Expected date:  Expected time:  Means of arrival:  Comments: 

## 2017-03-14 NOTE — Discharge Instructions (Signed)
Your ultrasound did not show any current pregnancy in the uterus. Most likely U have had a miscarriage, however, when we are unable to visualize a pregnancy inside the uterus and U currently have elevated pregnancy hormones. We like to have a follow-up in 48 hours at the [facility] for a repeat quantitative hCG test to make sure that it appears your pregnancy hormones are trending downward. This is to make sure that you do not have an ectopic pregnancy (A pregnancy outside of the uterus.) I will discharge you with Naproxen. You may take this for cramping. Please see immediate medical care if you're soaking through more than 4 pads in an hour or you become extremely short of breath or pass out.  Follow these instructions at home:  Your caregiver may order bed rest or may allow you to continue light activity. Resume activity as directed by your caregiver.  Have someone help with home and family responsibilities during this time.  Keep track of the number of sanitary pads you use each day and how soaked (saturated) they are. Write down this information.  Do not use tampons. Do not douche or have sexual intercourse until approved by your caregiver.  Only take over-the-counter or prescription medicines for pain or discomfort as directed by your caregiver.  Do not take aspirin. Aspirin can cause bleeding.  Keep all follow-up appointments with your caregiver.  If you or your partner have problems with grieving, talk to your caregiver or seek counseling to help cope with the pregnancy loss. Allow enough time to grieve before trying to get pregnant again. Get help right away if:  You have severe cramps or pain in your back or abdomen.  You have a fever.  You pass large blood clots (walnut-sized or larger) ortissue from your vagina. Save any tissue for your caregiver to inspect.  Your bleeding increases.  You have a thick, bad-smelling vaginal discharge.  You become lightheaded, weak,  or you faint.  You have chills.

## 2017-03-14 NOTE — ED Notes (Signed)
Pt ambulatory and independent at discharge.  Verbalized understanding of discharge instructions 

## 2017-03-14 NOTE — ED Notes (Signed)
Made pt aware that US exam can normally take a while and to expect longer stay in the ED as expected. Pt verbalized understanding.

## 2017-03-14 NOTE — ED Notes (Signed)
Pt reports she is [redacted] weeks pregnant, started to pass a large clot last night followed by bleeding.

## 2017-03-14 NOTE — ED Notes (Signed)
Family at bedside. 

## 2017-03-14 NOTE — ED Notes (Signed)
Pt's family is asking for [place] ETA, called [place] to check, Nicole Kindred states they are still waiting on HCG result which is in process at present verified by Lab

## 2017-03-15 LAB — GC/CHLAMYDIA PROBE AMP (~~LOC~~) NOT AT ARMC
Chlamydia: NEGATIVE
NEISSERIA GONORRHEA: NEGATIVE

## 2017-03-16 ENCOUNTER — Other Ambulatory Visit: Payer: Medicaid Other

## 2017-03-16 DIAGNOSIS — O3680X0 Pregnancy with inconclusive fetal viability, not applicable or unspecified: Secondary | ICD-10-CM

## 2017-03-17 LAB — RPR: RPR Ser Ql: NONREACTIVE

## 2017-03-17 LAB — HIV ANTIBODY (ROUTINE TESTING W REFLEX): HIV Screen 4th Generation wRfx: NONREACTIVE

## 2017-03-17 LAB — BETA HCG QUANT (REF LAB): HCG QUANT: 4511 m[IU]/mL

## 2017-03-22 ENCOUNTER — Telehealth: Payer: Self-pay | Admitting: *Deleted

## 2017-03-22 NOTE — Telephone Encounter (Signed)
Pt left message stating that she had lab test on 4/19 and would like the results.

## 2017-03-23 NOTE — Telephone Encounter (Signed)
Patient called into front office requesting bhcg results. Informed patient of results and that she would likely need another lab draw to follow the levels down. Patient verbalized understanding and states she already has an appt scheduled across the street for Monday but its a new OB appt. Told patient to still follow up but she might want to call them and let the know the change in appt type. Patient verbalized understanding and had no questions

## 2017-03-24 ENCOUNTER — Telehealth: Payer: Self-pay | Admitting: *Deleted

## 2017-03-24 NOTE — Telephone Encounter (Signed)
Patient left message, would like someone to call her about her results as soon as possible. She has recently had a miscarriage.

## 2017-03-24 NOTE — Telephone Encounter (Signed)
Called patient regarding results. Patient was already aware of the results but stated that she has a new ob appointment on Mon at 2pm. She is coming for her next beta hcg on Monday but needed to let us know that she will not have an ob appointment. Will inbox front office to make necessary schedule changes.

## 2017-03-27 ENCOUNTER — Ambulatory Visit (INDEPENDENT_AMBULATORY_CARE_PROVIDER_SITE_OTHER): Payer: Medicaid Other | Admitting: Obstetrics and Gynecology

## 2017-03-27 DIAGNOSIS — O039 Complete or unspecified spontaneous abortion without complication: Secondary | ICD-10-CM | POA: Diagnosis not present

## 2017-03-27 NOTE — Progress Notes (Signed)
36 yo [id number] presenting today as an ED follow up for the evaluation of a miscarriage. Patient was diagnosed with a miscarriage on 4/17. She reports vaginal bleeding for approximately 7 days. She reports doing well without complaints. She denies pain or abnormal discharge. This was an unplanned but desired pregnancy  No past medical history on file. No past surgical history on file. Family History  Problem Relation Age of Onset  . Hypertension Mother    Social History  Substance Use Topics  . Smoking status: Never Smoker  . Smokeless tobacco: Never Used  . Alcohol use No   ROS See pertinent in HPI  Last menstrual period 06/08/2016. GENERAL: Well-developed, well-nourished female in no acute distress.  ABDOMEN: Soft, nontender, nondistended. No organomegaly. PELVIC: Normal external female genitalia. Vagina is pink and rugated.  Normal discharge. Normal appearing cervix. Uterus is normal in size. No adnexal mass or tenderness. EXTREMITIES: No cyanosis, clubbing, or edema, 2+ distal pulses.  A/P 36 yo s/p spontaneous miscarriage - quant HCG today - patient understands that quant HCG needs to be followed weekly until negative - Patient desires to conceive in the next few months. Encouraged her to continue prenatal vitamins - RTC for annual exam or follow up with PCP

## 2017-03-27 NOTE — Progress Notes (Signed)
Patient is in the office for follow up after SAB on 03-14-17. Patient states that she has light spotting, no continuous bleeding. BP 115/73, pulse 93, weight 124lbs.

## 2017-03-28 ENCOUNTER — Telehealth: Payer: Self-pay

## 2017-03-28 DIAGNOSIS — O039 Complete or unspecified spontaneous abortion without complication: Secondary | ICD-10-CM

## 2017-03-28 LAB — BETA HCG QUANT (REF LAB): hCG Quant: 81 m[IU]/mL

## 2017-03-28 NOTE — Telephone Encounter (Signed)
-----   Message from Catalina Antigua, MD sent at 03/28/2017 10:58 AM EDT ----- Please inform patient of decreasing quant HCG. She needs to come in next week for repeat quant HCG  Thanks  [employer]

## 2017-03-28 NOTE — Telephone Encounter (Signed)
Pt aware quant results. Repeat quant due next week. Pt denies heavy VB and cramping at this time. Pt scheduled 5/7 for repeat quant.

## 2017-03-28 NOTE — Addendum Note (Signed)
Addended by: Dalphine Handing on: 03/28/2017 01:47 PM   Modules accepted: Orders

## 2017-04-03 ENCOUNTER — Other Ambulatory Visit: Payer: Medicaid Other

## 2017-04-03 DIAGNOSIS — O039 Complete or unspecified spontaneous abortion without complication: Secondary | ICD-10-CM

## 2017-04-04 LAB — BETA HCG QUANT (REF LAB): hCG Quant: 21 m[IU]/mL

## 2017-04-10 ENCOUNTER — Other Ambulatory Visit: Payer: Medicaid Other

## 2017-04-10 DIAGNOSIS — O039 Complete or unspecified spontaneous abortion without complication: Secondary | ICD-10-CM

## 2017-04-11 ENCOUNTER — Telehealth: Payer: Self-pay

## 2017-04-11 LAB — BETA HCG QUANT (REF LAB): hCG Quant: 8 m[IU]/mL

## 2017-04-11 NOTE — Telephone Encounter (Signed)
Unable to leave a  message. Called patient x 3, got recording -phone circuits are busy, please try to call again later.

## 2017-04-11 NOTE — Telephone Encounter (Signed)
Left detailed message regarding lab results. Pt advise to follow up as needed or call with any other questions.

## 2017-04-11 NOTE — Telephone Encounter (Signed)
-----   Message from Catalina AntiguaPeggy Constant, MD sent at 04/11/2017  9:40 AM EDT ----- Please inform patient of complete resolution of pregnancy. No need for further blood work  [employer]

## 2017-08-15 ENCOUNTER — Ambulatory Visit: Payer: Medicaid Other | Admitting: Certified Nurse Midwife

## 2019-01-06 IMAGING — US US OB COMP LESS 14 WK
1 series · 14 of 28 positions shown · non-contrast
Comparison: None.

CLINICAL DATA: Vaginal bleeding for 2 days

EXAM:
OBSTETRIC <14 WK US AND TRANSVAGINAL OB US
TECHNIQUE: Both transabdominal and transvaginal ultrasound examinations were
performed for complete evaluation of the gestation as well as the
maternal uterus, adnexal regions, and pelvic cul-de-sac.
Transvaginal technique was performed to assess early pregnancy.

[Series 1: us ob comp less 14 wk · 0.20mm/px · 14 of 62 slices shown]
[im 3/62]
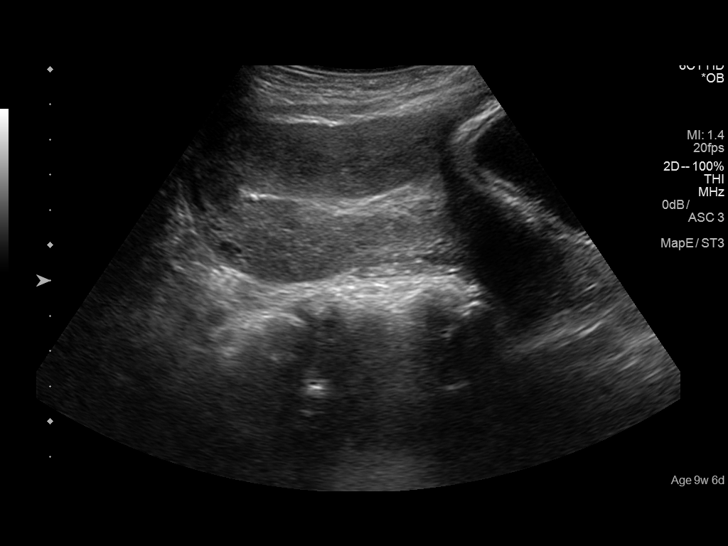
[im 7/62]
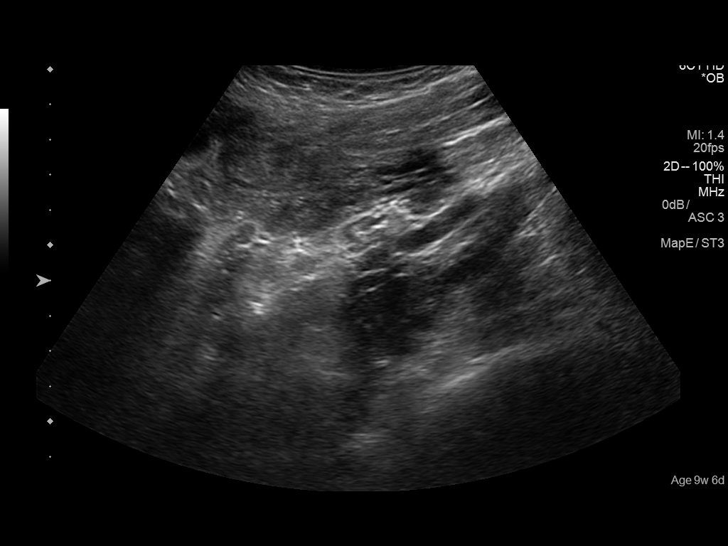
[im 12/62]
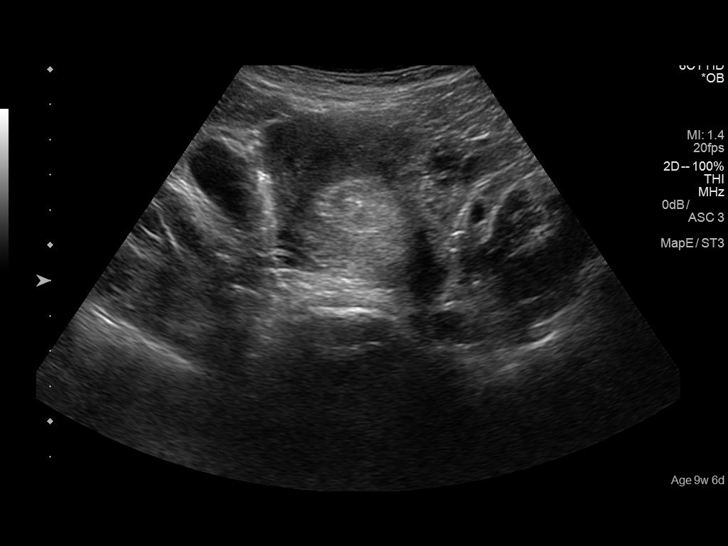
[im 16/62]
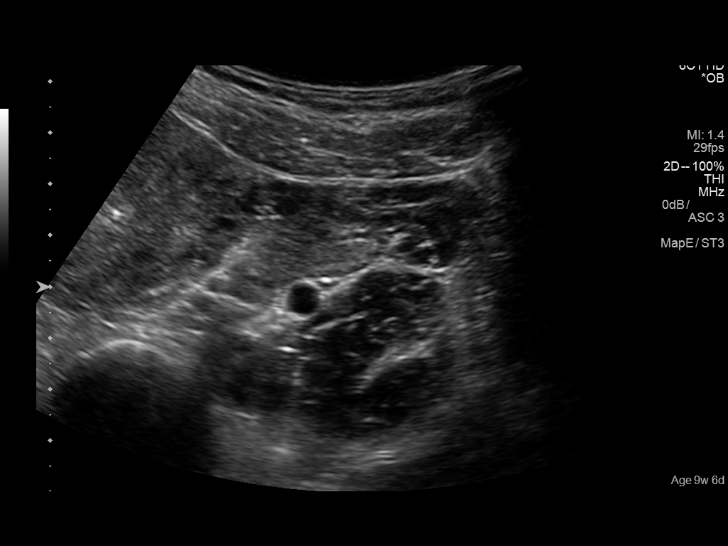
[im 21/62]
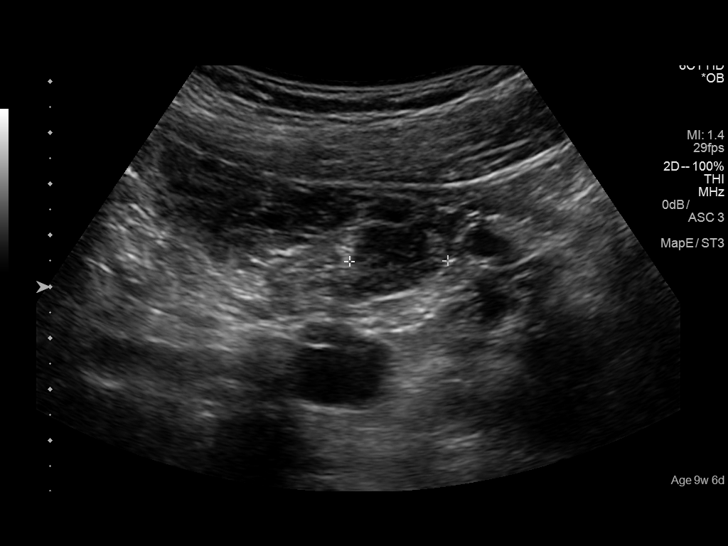
[im 25/62]
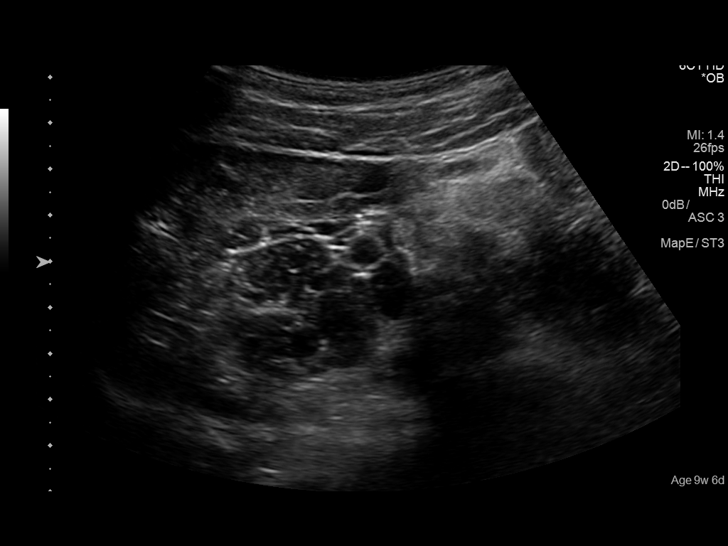
[im 30/62]
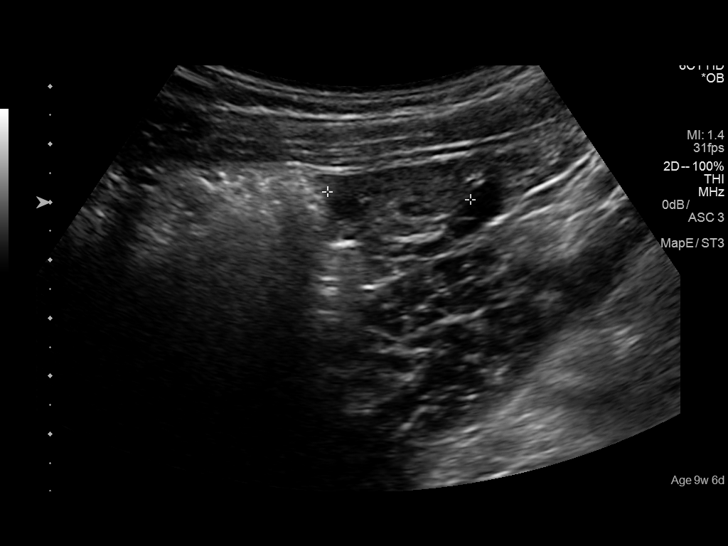
[im 34/62]
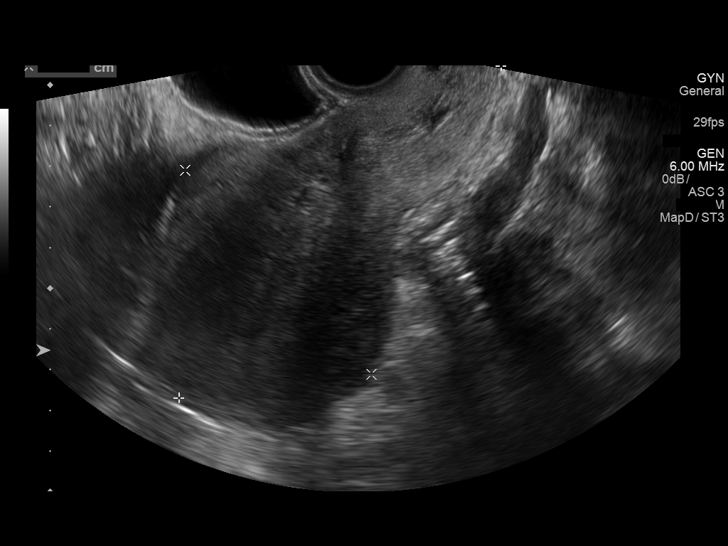
[im 39/62]
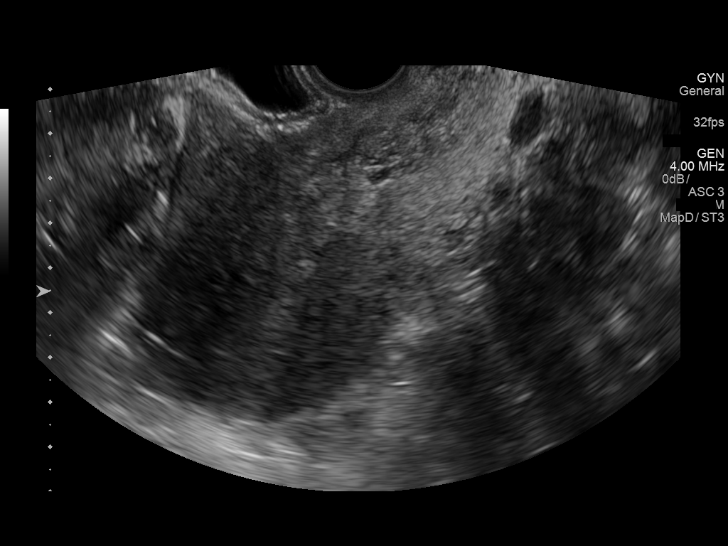
[im 43/62]
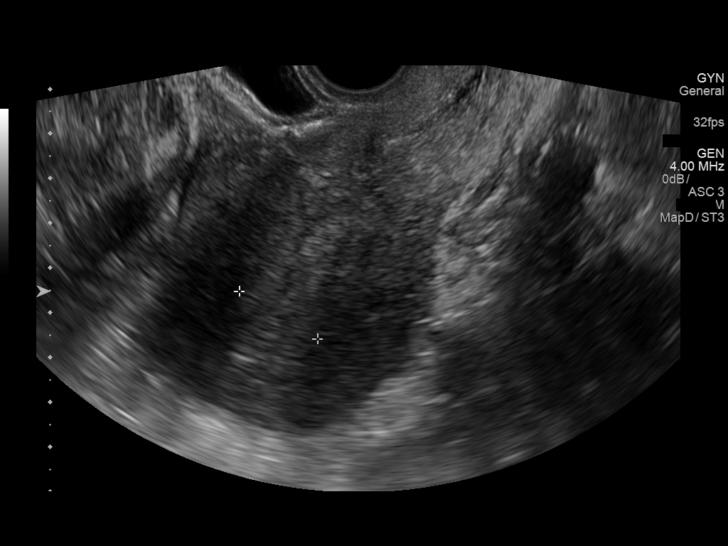
[im 48/62]
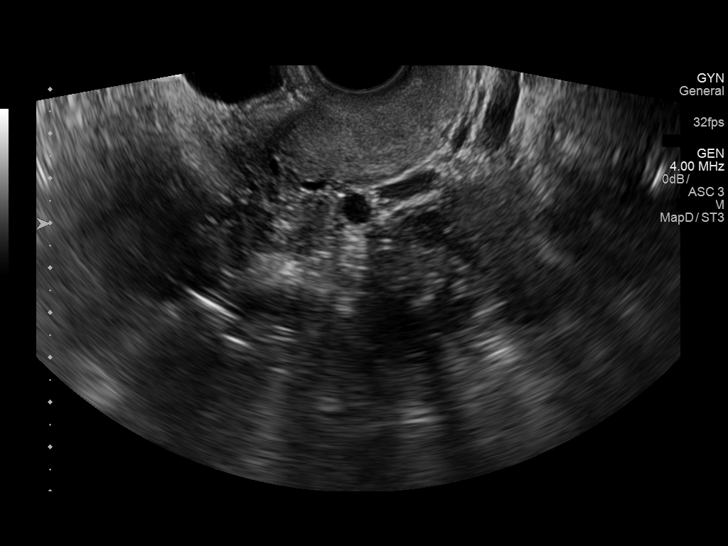
[im 52/62]
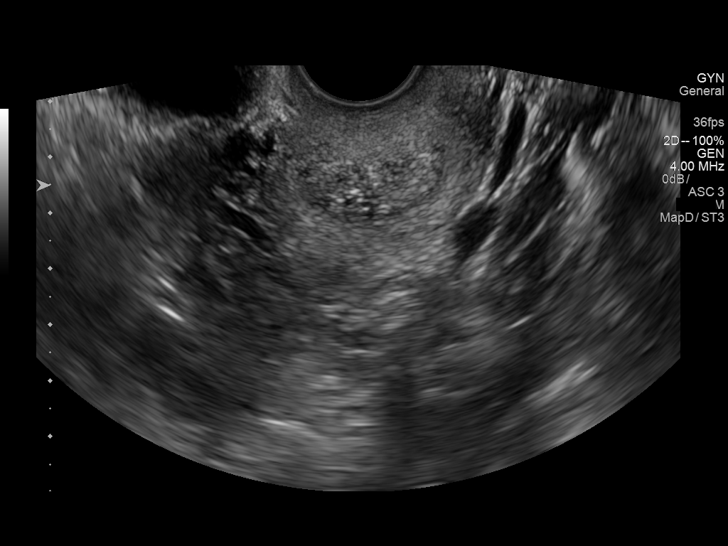
[im 57/62]
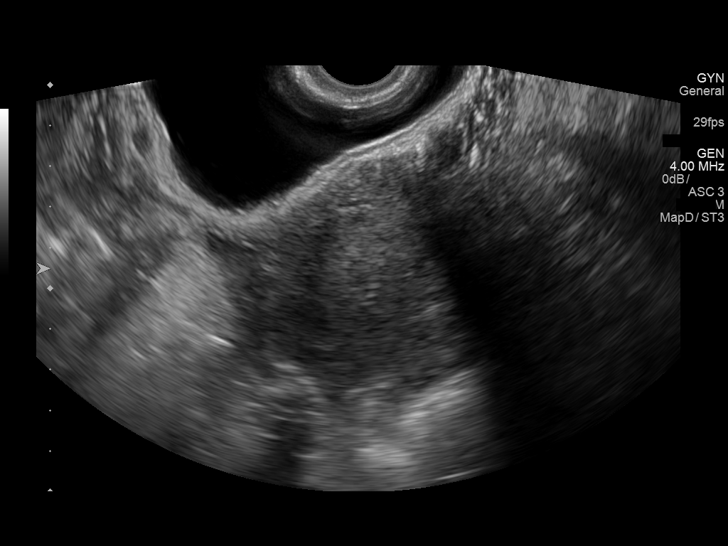
[im 62/62]
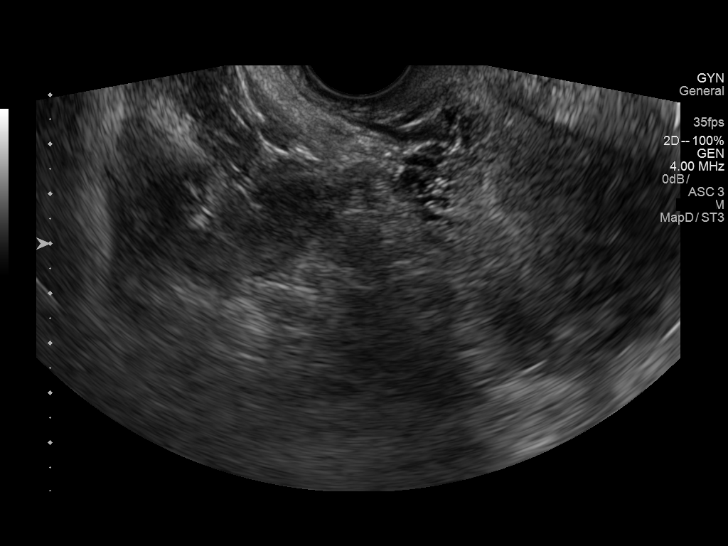

[14 of 28 positions shown; findings below may reference images not displayed]

FINDINGS: Intrauterine gestational sac: None

Yolk sac:  Not Visualized.

Embryo:  Not Visualized.

Cardiac Activity: Not Visualized.

Subchorionic hemorrhage:  None visualized.

Maternal uterus/adnexae: Heterogeneous thickened endometrium
measuring 20.5 mm. No adnexal mass. No pelvic free fluid. Left ovary
measures 3.6 x 1.4 x 1.9 cm. Right ovary measures 3.4 x 1.4 x
cm.
IMPRESSION: 1. No intrauterine gestation sac identified. Given the elevated beta
HCG, differential diagnosis includes missed abortion versus ectopic
pregnancy. Recommend clinical correlation, serial quantitative beta
HCGs, ectopic precautions, and followup ultrasound as clinically
indicated.

## 2020-03-03 ENCOUNTER — Other Ambulatory Visit: Payer: Self-pay

## 2020-03-03 ENCOUNTER — Ambulatory Visit (INDEPENDENT_AMBULATORY_CARE_PROVIDER_SITE_OTHER): Payer: Medicaid Other

## 2020-03-03 DIAGNOSIS — Z3202 Encounter for pregnancy test, result negative: Secondary | ICD-10-CM

## 2020-03-03 DIAGNOSIS — N912 Amenorrhea, unspecified: Secondary | ICD-10-CM

## 2020-03-03 DIAGNOSIS — Z3490 Encounter for supervision of normal pregnancy, unspecified, unspecified trimester: Secondary | ICD-10-CM

## 2020-03-03 LAB — POCT URINE PREGNANCY: Preg Test, Ur: NEGATIVE

## 2020-03-03 NOTE — Progress Notes (Signed)
Patient ID: Amy Ruiz, female   DOB: 1981/07/08, 39 y.o.   MRN: [id number] Patient seen and assessed by nursing staff during this encounter. I have reviewed the chart and agree with the documentation and plan. I have also made any necessary editorial changes.  Scheryl Darter, MD 03/03/2020 11:18 AM

## 2020-03-03 NOTE — Progress Notes (Signed)
Ms. Tarbell presents today for UPT. She pt notes nausea and states her stomach feels bigger.  LMP:12/2019 est the week before 01/12/2020. pt notes cycles have been irregular.    OBJECTIVE: Appears well, in no apparent distress.  OB History    Gravida  1   Para      Term      Preterm      AB      Living        SAB      TAB      Ectopic      Multiple      Live Births             Home UPT Result: pt notes taking 2 test at home one was faint and one was negative. In-Office UPT result: Negative  I have reviewed the patient's medical, obstetrical, social, and family histories, and medications.   ASSESSMENT: Negative pregnancy test  PLAN Make appt with provider to discuss irregular periods and other issues.

## 2020-03-09 ENCOUNTER — Other Ambulatory Visit: Payer: Self-pay

## 2020-03-09 ENCOUNTER — Ambulatory Visit: Payer: Medicaid Other | Admitting: Obstetrics and Gynecology

## 2020-03-09 ENCOUNTER — Other Ambulatory Visit (HOSPITAL_COMMUNITY)
Admission: RE | Admit: 2020-03-09 | Discharge: 2020-03-09 | Disposition: A | Discharge status: Home / Self Care | Payer: Medicaid Other | Source: Ambulatory Visit | Attending: Obstetrics and Gynecology | Admitting: Obstetrics and Gynecology

## 2020-03-09 ENCOUNTER — Encounter: Payer: Self-pay | Admitting: Obstetrics and Gynecology

## 2020-03-09 VITALS — BP 126/80 | HR 75 | Ht 61.0 in | Wt 124.7 lb

## 2020-03-09 DIAGNOSIS — Z124 Encounter for screening for malignant neoplasm of cervix: Secondary | ICD-10-CM

## 2020-03-09 DIAGNOSIS — Z1272 Encounter for screening for malignant neoplasm of vagina: Secondary | ICD-10-CM

## 2020-03-09 DIAGNOSIS — N939 Abnormal uterine and vaginal bleeding, unspecified: Secondary | ICD-10-CM | POA: Diagnosis not present

## 2020-03-09 DIAGNOSIS — N760 Acute vaginitis: Secondary | ICD-10-CM | POA: Diagnosis not present

## 2020-03-09 NOTE — Progress Notes (Signed)
39 yo P2 here for the evaluation of irregular cycles. Patient is sexually active and desires a pregnancy. She reports a monthly period lasting 5 days, the last normal one being in January. Patient reports 3 days of light bleeding in February, March and April. She reports abdominal enlargement. She denies any pelvic pain or dyspareunia. Patient reports the presence of a white non pruritic, odorless discharge.  No past medical history on file. No past surgical history on file. Family History  Problem Relation Age of Onset  . Hypertension Mother    Social History   Tobacco Use  . Smoking status: Never Smoker  . Smokeless tobacco: Never Used  Substance Use Topics  . Alcohol use: No  . Drug use: No   ROS See pertinent in HPI. All other systems reviewed and negative Blood pressure 126/80, pulse 75, height 5\' 1"  (1.549 m), weight 124 lb 11.2 oz (56.6 kg), last menstrual period 03/01/2020, unknown if currently breastfeeding. GENERAL: Well-developed, well-nourished female in no acute distress.  ABDOMEN: Soft, nontender, nondistended. Palpable mass above pubic symphysis PELVIC: Normal external female genitalia. Vagina is pink and rugated.  Normal discharge. Normal appearing cervix. Uterus is 14-16 weeks in size. No adnexal mass or tenderness. EXTREMITIES: No cyanosis, clubbing, or edema, 2+ distal pulses.  Pt informed that the ultrasound is considered a limited OB ultrasound and is not intended to be a complete ultrasound exam.  Patient also informed that the ultrasound is not being completed with the intent of assessing for fetal or placental anomalies or any pelvic abnormalities.  Explained that the purpose of today's ultrasound is to assess for  pregnancy.  Patient acknowledges the purpose of the exam and the limitations of the study.  Patient informed that no pregnancy was visualized. A thin endometrial stripe and an acutely anterior cervix visualized   A/P 39 yo with AUB and vaginitis -  Pelvic ultrasound ordered - Pap smear and vaginal swab collected - patient will be contacted with abnormal results

## 2020-03-09 NOTE — Progress Notes (Signed)
Patient presents for irregular cycles. Patient states that she been having irregular cycles since January. She took a hpt 2 weeks ago and got a faint positive. She then had a negative test on 4/6 here in the office. Also complains of having breast tenderness and feeling nauseous. She did have some bleeding for 3 days that started on 03/01/20. She does states that she has a lot of watery discharge, but no odor or irritation. Pregnancy test today is negative.  Last pap: 04/2017 Normal

## 2020-03-10 LAB — CERVICOVAGINAL ANCILLARY ONLY
Bacterial Vaginitis (gardnerella): POSITIVE — AB
Candida Glabrata: NEGATIVE
Candida Vaginitis: POSITIVE — AB
Chlamydia: NEGATIVE
Comment: NEGATIVE
Comment: NEGATIVE
Comment: NEGATIVE
Comment: NEGATIVE
Comment: NEGATIVE
Comment: NORMAL
Neisseria Gonorrhea: NEGATIVE
Trichomonas: NEGATIVE

## 2020-03-12 ENCOUNTER — Telehealth: Payer: Self-pay

## 2020-03-12 MED ORDER — FLUCONAZOLE 150 MG PO TABS: 150.0000 mg | ORAL_TABLET | Freq: Once | ORAL | 0 refills | Status: AC

## 2020-03-12 MED ORDER — METRONIDAZOLE 500 MG PO TABS: 500.0000 mg | ORAL_TABLET | Freq: Two times a day (BID) | ORAL | 0 refills | Status: DC

## 2020-03-12 NOTE — Addendum Note (Signed)
Addended by: Catalina Antigua on: 03/12/2020 09:33 AM   Modules accepted: Orders

## 2020-03-12 NOTE — Telephone Encounter (Signed)
Called to advise of results and rx, no answer, left vm ?

## 2020-03-12 NOTE — Telephone Encounter (Signed)
Pt returned call; test results given, pt will pick up rx today.

## 2020-03-13 LAB — CYTOLOGY - PAP
Adequacy: ABSENT
Comment: NEGATIVE
Diagnosis: UNDETERMINED — AB
High risk HPV: NEGATIVE

## 2020-03-17 ENCOUNTER — Ambulatory Visit (HOSPITAL_COMMUNITY): Payer: Medicaid Other | Attending: Obstetrics and Gynecology

## 2020-04-15 ENCOUNTER — Ambulatory Visit
Admission: RE | Admit: 2020-04-15 | Discharge: 2020-04-15 | Disposition: A | Discharge status: Home / Self Care | Payer: Medicaid Other | Source: Ambulatory Visit | Attending: Obstetrics and Gynecology | Admitting: Obstetrics and Gynecology

## 2020-04-15 ENCOUNTER — Telehealth: Payer: Self-pay

## 2020-04-15 ENCOUNTER — Other Ambulatory Visit: Payer: Self-pay

## 2020-04-15 DIAGNOSIS — N939 Abnormal uterine and vaginal bleeding, unspecified: Secondary | ICD-10-CM | POA: Insufficient documentation

## 2020-04-15 NOTE — Telephone Encounter (Signed)
Called pt and advised of u/s results. Pt was upset and stated that no one can confirm her pregnancy, pt states that her stomach is big and she is feeling fetal movement and the u/s is wrong. Offered pt follow up appt, pt said she will go somewhere else, notified office manager to follow up with pt.

## 2020-04-19 ENCOUNTER — Emergency Department (HOSPITAL_COMMUNITY): Payer: Medicaid Other

## 2020-04-19 ENCOUNTER — Emergency Department (HOSPITAL_COMMUNITY)
Admission: EM | Admit: 2020-04-19 | Discharge: 2020-04-19 | Disposition: A | Discharge status: Home / Self Care | Payer: Medicaid Other | Attending: Emergency Medicine | Admitting: Emergency Medicine

## 2020-04-19 ENCOUNTER — Other Ambulatory Visit: Payer: Self-pay

## 2020-04-19 DIAGNOSIS — N800 Endometriosis of uterus: Secondary | ICD-10-CM | POA: Diagnosis not present

## 2020-04-19 DIAGNOSIS — Z79899 Other long term (current) drug therapy: Secondary | ICD-10-CM | POA: Insufficient documentation

## 2020-04-19 DIAGNOSIS — N8003 Adenomyosis of the uterus: Secondary | ICD-10-CM

## 2020-04-19 DIAGNOSIS — N939 Abnormal uterine and vaginal bleeding, unspecified: Secondary | ICD-10-CM | POA: Diagnosis not present

## 2020-04-19 DIAGNOSIS — R102 Pelvic and perineal pain: Secondary | ICD-10-CM | POA: Diagnosis not present

## 2020-04-19 LAB — URINALYSIS, ROUTINE W REFLEX MICROSCOPIC
Bilirubin Urine: NEGATIVE
Glucose, UA: NEGATIVE mg/dL
Ketones, ur: NEGATIVE mg/dL
Leukocytes,Ua: NEGATIVE
Nitrite: NEGATIVE
Protein, ur: NEGATIVE mg/dL
RBC / HPF: >50 RBC/hpf — ABNORMAL HIGH (ref 0–5)
Specific Gravity, Urine: 1.009 (ref 1.005–1.030)
pH: 9 — ABNORMAL HIGH (ref 5.0–8.0)

## 2020-04-19 LAB — WET PREP, GENITAL
Sperm: NONE SEEN
Trich, Wet Prep: NONE SEEN
Yeast Wet Prep HPF POC: NONE SEEN

## 2020-04-19 LAB — CBC WITH DIFFERENTIAL/PLATELET
Abs Immature Granulocytes: 0.01 10*3/uL (ref 0.00–0.07)
Basophils Absolute: 0.1 10*3/uL (ref 0.0–0.1)
Basophils Relative: 1 %
Eosinophils Absolute: 0.2 10*3/uL (ref 0.0–0.5)
Eosinophils Relative: 4 %
HCT: 37.2 % (ref 36.0–46.0)
Hemoglobin: 11.6 g/dL — ABNORMAL LOW (ref 12.0–15.0)
Immature Granulocytes: 0 %
Lymphocytes Relative: 23 %
Lymphs Abs: 1.1 10*3/uL (ref 0.7–4.0)
MCH: 25.2 pg — ABNORMAL LOW (ref 26.0–34.0)
MCHC: 31.2 g/dL (ref 30.0–36.0)
MCV: 80.7 fL (ref 80.0–100.0)
Monocytes Absolute: 0.4 10*3/uL (ref 0.1–1.0)
Monocytes Relative: 9 %
Neutro Abs: 2.8 10*3/uL (ref 1.7–7.7)
Neutrophils Relative %: 63 %
Platelets: 164 10*3/uL (ref 150–400)
RBC: 4.61 MIL/uL (ref 3.87–5.11)
RDW: 18.1 % — ABNORMAL HIGH (ref 11.5–15.5)
WBC: 4.5 10*3/uL (ref 4.0–10.5)
nRBC: 0 % (ref 0.0–0.2)

## 2020-04-19 LAB — ABO/RH: ABO/RH(D): O POS

## 2020-04-19 LAB — I-STAT BETA HCG BLOOD, ED (MC, WL, AP ONLY): I-stat hCG, quantitative: <5 m[IU]/mL (ref <5)

## 2020-04-19 LAB — BASIC METABOLIC PANEL
Anion gap: 10 (ref 5–15)
BUN: 11 mg/dL (ref 6–20)
CO2: 27 mmol/L (ref 22–32)
Calcium: 9.8 mg/dL (ref 8.9–10.3)
Chloride: 102 mmol/L (ref 98–111)
Creatinine, Ser: 0.87 mg/dL (ref 0.44–1.00)
GFR calc Af Amer: >60 mL/min (ref >60)
GFR calc non Af Amer: >60 mL/min (ref >60)
Glucose, Bld: 87 mg/dL (ref 70–99)
Potassium: 3.4 mmol/L — ABNORMAL LOW (ref 3.5–5.1)
Sodium: 139 mmol/L (ref 135–145)

## 2020-04-19 LAB — HCG, QUANTITATIVE, PREGNANCY: hCG, Beta Chain, Quant, S: <1 m[IU]/mL (ref <5)
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
## 2020-04-19 NOTE — Discharge Instructions (Signed)
You will need to follow-up with your primary care provider and OB/GYN for further evaluation and management of your symptoms. You can take ibuprofen or Tylenol as needed to help with your pain. Return to the ER if you start to experience worsening bleeding, pelvic pain, lightheadedness.
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
## 2020-04-20 ENCOUNTER — Ambulatory Visit: Payer: Medicaid Other | Admitting: Obstetrics & Gynecology

## 2020-04-20 ENCOUNTER — Encounter: Payer: Self-pay | Admitting: Obstetrics & Gynecology

## 2020-04-20 VITALS — BP 135/77 | HR 77 | Wt 122.8 lb

## 2020-04-20 DIAGNOSIS — R140 Abdominal distension (gaseous): Secondary | ICD-10-CM | POA: Diagnosis not present

## 2020-04-20 DIAGNOSIS — N800 Endometriosis of uterus: Secondary | ICD-10-CM

## 2020-04-20 DIAGNOSIS — N8003 Adenomyosis of the uterus: Secondary | ICD-10-CM

## 2020-04-20 LAB — URINE CULTURE: Culture: <10000 — AB

## 2020-04-20 LAB — GC/CHLAMYDIA PROBE AMP (~~LOC~~) NOT AT ARMC
Chlamydia: NEGATIVE
Comment: NEGATIVE
Comment: NORMAL
Neisseria Gonorrhea: NEGATIVE

## 2020-04-20 NOTE — Progress Notes (Signed)
GYNECOLOGY OFFICE VISIT NOTE  History:   Amy Ruiz is a 39 y.o. G1P0010 here today for discussion of ultrasound results. She initially presented with pelvic bloating and thought she was pregnant, various ultrasounds and lab tests have shown that not to be the case.  Ultrasound did show an enlarged uterus with adenomyosis.  Patient feels that she is not getting a straightforward answer about what is causing her bloating; feels she is being told different things from different providers.  She denies any abnormal vaginal discharge, bleeding, pelvic pain or other concerns.    No past medical history on file.  No past surgical history on file.  The following portions of the patient's history were reviewed and updated as appropriate: allergies, current medications, past family history, past medical history, past social history, past surgical history and problem list.   Health Maintenance:  ASCUS pap and negative HRHPV on 03/09/2020.    Review of Systems:  Pertinent items noted in HPI and remainder of comprehensive ROS otherwise negative.  Physical Exam:  BP 135/77   Pulse 77   Wt 122 lb 12.8 oz (55.7 kg)   BMI 23.20 kg/m  CONSTITUTIONAL: Well-developed, well-nourished female in no acute distress.  HEENT:  Normocephalic, atraumatic. External right and left ear normal. No scleral icterus.  NECK: Normal range of motion, supple, no masses noted on observation SKIN: No rash noted. Not diaphoretic. No erythema. No pallor. MUSCULOSKELETAL: Normal range of motion. No edema noted. NEUROLOGIC: Alert and oriented to person, place, and time. Normal muscle tone coordination. No cranial nerve deficit noted. CARDIOVASCULAR: Normal heart rate noted RESPIRATORY: Effort and breath sounds normal, no problems with respiration noted ABDOMEN: Deferred  PELVIC: Deferred  Imaging US Transvaginal Non-OB  Result Date: 04/19/2020 CLINICAL DATA:  Pelvic pain leg cramping, bleeding EXAM: TRANSABDOMINAL  AND TRANSVAGINAL ULTRASOUND OF PELVIS DOPPLER ULTRASOUND OF OVARIES TECHNIQUE: Both transabdominal and transvaginal ultrasound examinations of the pelvis were performed. Transabdominal technique was performed for global imaging of the pelvis including uterus, ovaries, adnexal regions, and pelvic cul-de-sac. It was necessary to proceed with endovaginal exam following the transabdominal exam to visualize the uterus, endometrium, ovaries, and adnexa. Color and duplex Doppler ultrasound was utilized to evaluate blood flow to the ovaries. COMPARISON:  04/15/2020 FINDINGS: Uterus Measurements: 12.7 x 5.5 x 6.7 cm = volume: 246 mL. Retroflexed uterus. Heterogeneous, hypoechoic myometrium. No fibroids or other mass visualized. Endometrium Thickness: 4 mm.  No focal abnormality visualized. Right ovary Measurements: 2.8 x 1.7 x 2.0 cm = volume: 5 mL. Normal appearance/no adnexal mass. Left ovary Measurements: 2.7 x 1.9 x 1.8 cm = volume: 5 mL. Small follicles. Normal appearance/no adnexal mass. Pulsed Doppler evaluation of both ovaries demonstrates normal low-resistance arterial and venous waveforms. Other findings Nabothian cysts in the cervix.  No abnormal free fluid. IMPRESSION: 1.  No acute ultrasound abnormality of the pelvis to explain pain. 2. Probable uterine adenomyosis, appearance similar to prior examination. Electronically Signed   By: Eddie Candle M.D.   On: 04/19/2020 10:27   US Pelvis Complete  Result Date: 04/19/2020 CLINICAL DATA:  Pelvic pain leg cramping, bleeding EXAM: TRANSABDOMINAL AND TRANSVAGINAL ULTRASOUND OF PELVIS DOPPLER ULTRASOUND OF OVARIES TECHNIQUE: Both transabdominal and transvaginal ultrasound examinations of the pelvis were performed. Transabdominal technique was performed for global imaging of the pelvis including uterus, ovaries, adnexal regions, and pelvic cul-de-sac. It was necessary to proceed with endovaginal exam following the transabdominal exam to visualize the uterus,  endometrium, ovaries, and adnexa. Color and duplex  Doppler ultrasound was utilized to evaluate blood flow to the ovaries. COMPARISON:  04/15/2020 FINDINGS: Uterus Measurements: 12.7 x 5.5 x 6.7 cm = volume: 246 mL. Retroflexed uterus. Heterogeneous, hypoechoic myometrium. No fibroids or other mass visualized. Endometrium Thickness: 4 mm.  No focal abnormality visualized. Right ovary Measurements: 2.8 x 1.7 x 2.0 cm = volume: 5 mL. Normal appearance/no adnexal mass. Left ovary Measurements: 2.7 x 1.9 x 1.8 cm = volume: 5 mL. Small follicles. Normal appearance/no adnexal mass. Pulsed Doppler evaluation of both ovaries demonstrates normal low-resistance arterial and venous waveforms. Other findings Nabothian cysts in the cervix.  No abnormal free fluid. IMPRESSION: 1.  No acute ultrasound abnormality of the pelvis to explain pain. 2. Probable uterine adenomyosis, appearance similar to prior examination. Electronically Signed   By: Lauralyn Primes M.D.   On: 04/19/2020 10:27   [place] Art/Ven Flow Abd Pelv Doppler  Result Date: 04/19/2020 CLINICAL DATA:  Pelvic pain leg cramping, bleeding EXAM: TRANSABDOMINAL AND TRANSVAGINAL ULTRASOUND OF PELVIS DOPPLER ULTRASOUND OF OVARIES TECHNIQUE: Both transabdominal and transvaginal ultrasound examinations of the pelvis were performed. Transabdominal technique was performed for global imaging of the pelvis including uterus, ovaries, adnexal regions, and pelvic cul-de-sac. It was necessary to proceed with endovaginal exam following the transabdominal exam to visualize the uterus, endometrium, ovaries, and adnexa. Color and duplex Doppler ultrasound was utilized to evaluate blood flow to the ovaries. COMPARISON:  04/15/2020 FINDINGS: Uterus Measurements: 12.7 x 5.5 x 6.7 cm = volume: 246 mL. Retroflexed uterus. Heterogeneous, hypoechoic myometrium. No fibroids or other mass visualized. Endometrium Thickness: 4 mm.  No focal abnormality visualized. Right ovary Measurements: 2.8 x 1.7 x  2.0 cm = volume: 5 mL. Normal appearance/no adnexal mass. Left ovary Measurements: 2.7 x 1.9 x 1.8 cm = volume: 5 mL. Small follicles. Normal appearance/no adnexal mass. Pulsed Doppler evaluation of both ovaries demonstrates normal low-resistance arterial and venous waveforms. Other findings Nabothian cysts in the cervix.  No abnormal free fluid. IMPRESSION: 1.  No acute ultrasound abnormality of the pelvis to explain pain. 2. Probable uterine adenomyosis, appearance similar to prior examination. Electronically Signed   By: Lauralyn Primes M.D.   On: 04/19/2020 10:27   US PELVIC COMPLETE WITH TRANSVAGINAL  Result Date: 04/15/2020 CLINICAL DATA:  Abnormal uterine bleeding, question fibroids or adenomyosis with uterus at a 14-[redacted] week pregnant size on physical exam; LMP = 03/01/2020 EXAM: TRANSABDOMINAL AND TRANSVAGINAL ULTRASOUND OF PELVIS TECHNIQUE: Both transabdominal and transvaginal ultrasound examinations of the pelvis were performed. Transabdominal technique was performed for global imaging of the pelvis including uterus, ovaries, adnexal regions, and pelvic cul-de-sac. It was necessary to proceed with endovaginal exam following the transabdominal exam to visualize the ovaries. COMPARISON:  None FINDINGS: Uterus Measurements: 12.1 x 5.4 x 6.9 cm = volume: 232 mL. Retroverted. Heterogeneous myometrium. Asymmetric thickening of the posterior myometrium of the uterus versus anterior. Area of somewhat defined masslike echogenicity 2.7 x 3.4 x 3.0 cm, associated with areas of linear shadowing without calcification, question focal adenomyosis versus subtle leiomyoma, extends submucosal. No additional masses. Endometrium Thickness: 8 mm.  No endometrial fluid or focal abnormality Right ovary Measurements: 2.7 x 1.3 x 1.6 cm = volume: 2.9 mL. Normal morphology without mass Left ovary Measurements: 2.7 x 1.7 x 1.9 cm = volume: 4.7 mL. Dominant follicle without mass Other findings No free pelvic fluid.  No adnexal  masses. IMPRESSION: Asymmetric thickening of the posterior myometrial wall with questionable masslike appearance 3.4 x 3.0 x 2.7 cm in size, could represent  focal adenomyosis or a subtle submucosal leiomyoma. Unremarkable endometrial complex and ovaries. Electronically Signed   By: Ulyses Southward M.D.   On: 04/15/2020 10:13      Assessment and Plan:      1. Adenomyosis 2. Abdominal bloating Discussed ultrasound results in detail.  Patient feels she is being told different things by deifferent providers. Explained that her uterus shows adenomyosis but is not the cause of her abdominal bloating as it is only 12 week size, still beneath her pubic bone.  She kept saying that she was told she had a retroverted uterus; tried to assure her that this was normal and not pathologic and does not cause her bloating. She asked about management of her adenomyosis; but doubts this diagnosis as she does not have pain or abnormal bleeding.  She was told she can be asymptomatic, tried to explain that many people can have asymptomatic conditions.  Told her this was reassuring as there was no need for any intervention.  Patient had a lot of concerns about items in her past diagnosis list on past visits, I tried to explain them the best I could but sensed an inherent distrust on her part.  I told her about other pelvic organs, and other possible etiologies for her bloating that are not necessarily picked up on pelvic ultrasound; pelvic ultrasound only focuses on her gynecologic organs and no gynecologic etiology is found for her "feeling like I do when I am pregnant".  Patient was unsatisfied with my explanation, and said she will follow up with her PCP and find another GYN provider.   Routine preventative health maintenance measures emphasized. Please refer to After Visit Summary for other counseling recommendations.   Return for any gynecologic concerns.    Total face-to-face time with patient: 30 minutes.  100% of  encounter was spent on detailed discussion about ultrasound results and all concerns brought up by patient.   Jaynie Collins, MD, FACOG Obstetrician & Gynecologist, [facility] for [employer], [facility]

## 2020-07-25 IMAGING — US US PELVIS COMPLETE WITH TRANSVAGINAL
1 series · 15 of 25 positions shown · non-contrast
Comparison: None

CLINICAL DATA: Abnormal uterine bleeding, question fibroids or
adenomyosis with uterus at a 14-16 week pregnant size on physical
exam; LMP = 03/01/2020



[Series 1: us pelvis complete with transvaginal · 15 of 91 slices shown]
[im 1/91]
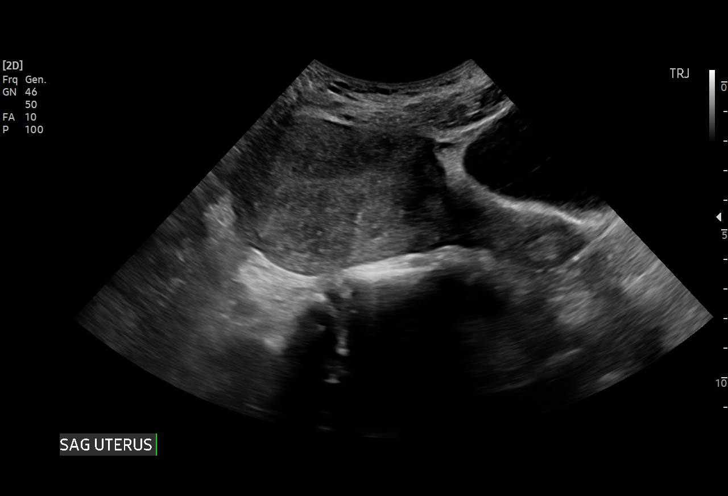
[im 8/91]
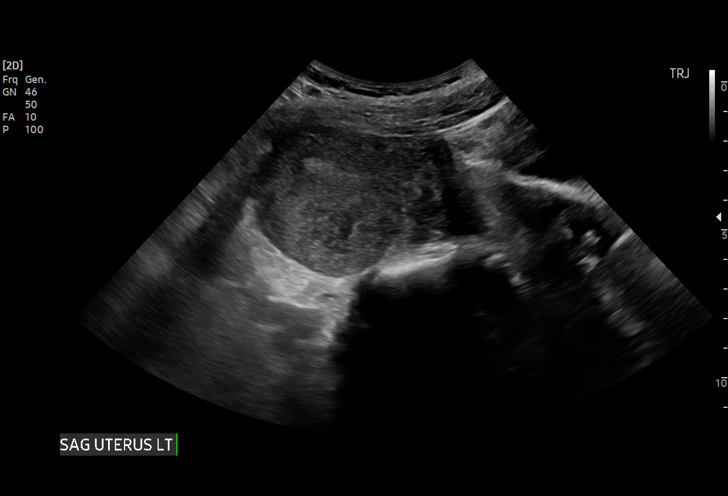
[im 16/91]
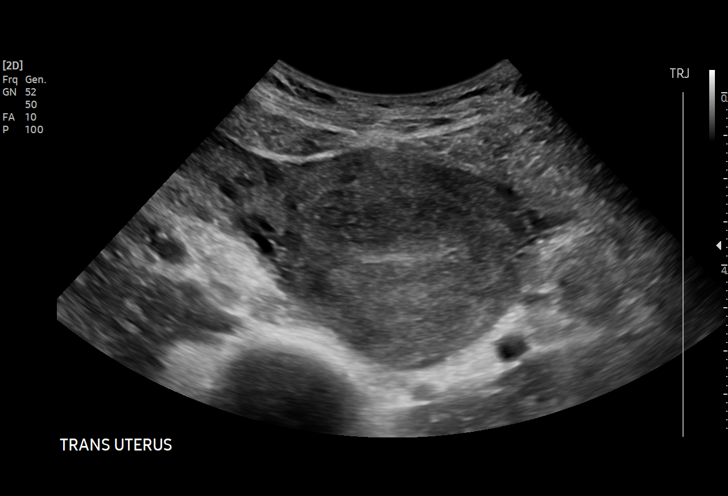
[im 19/91]
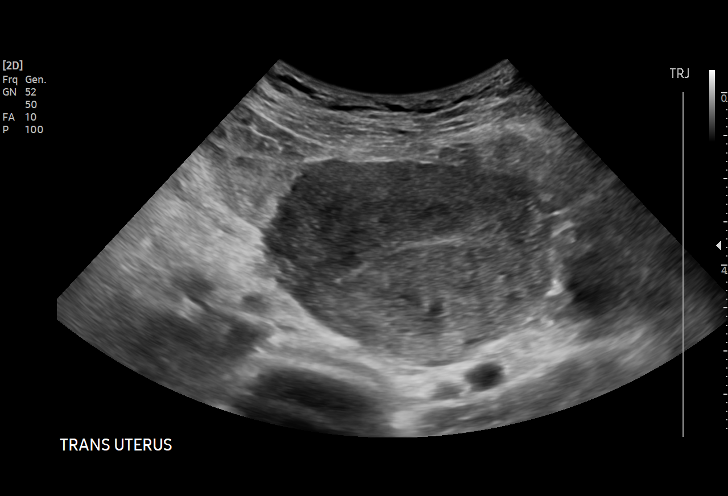
[im 27/91]
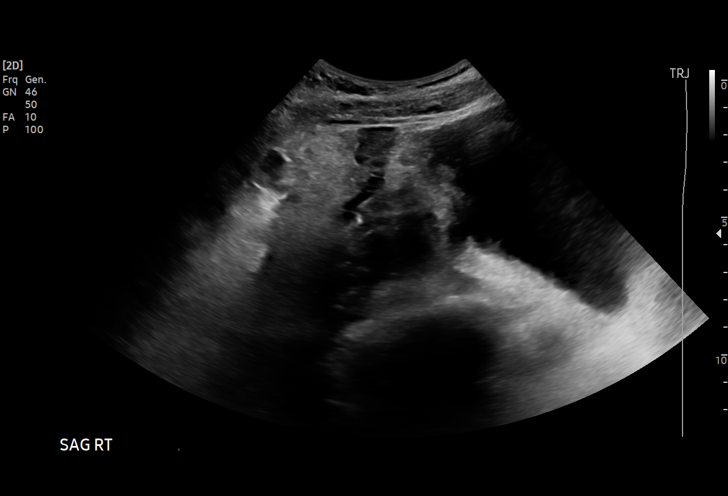
[im 34/91]
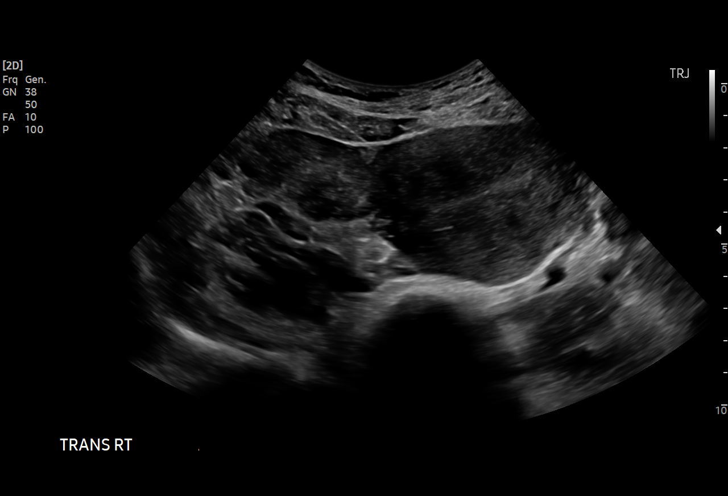
[im 38/91]
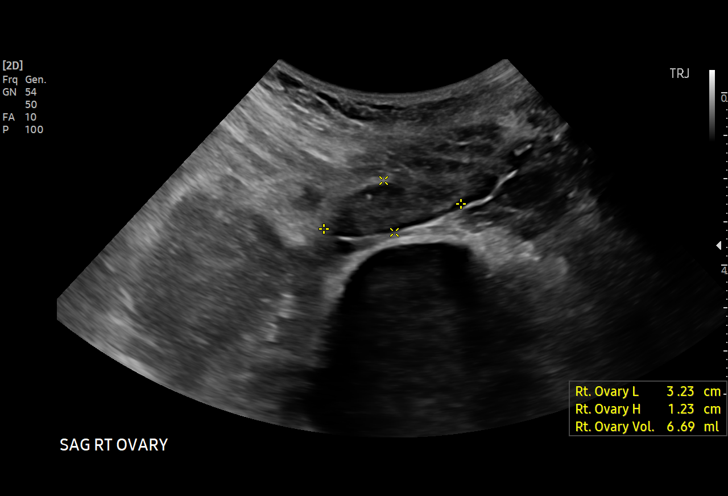
[im 46/91]
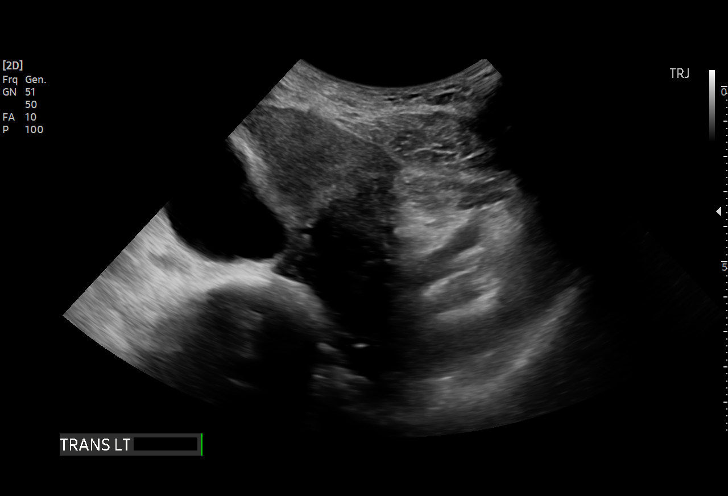
[im 53/91]
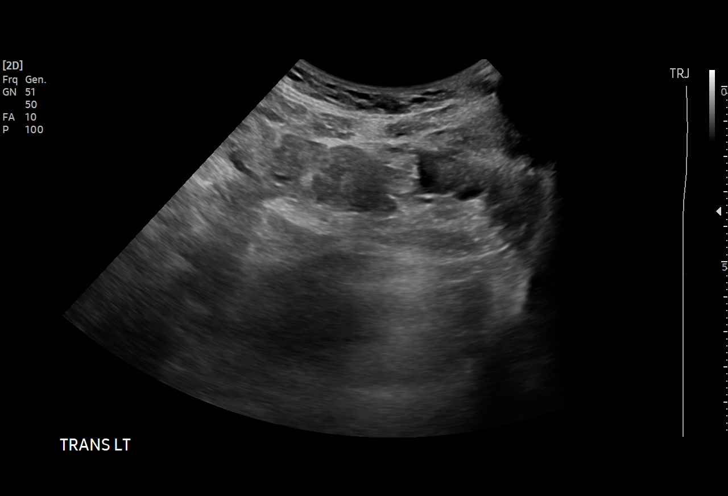
[im 57/91]
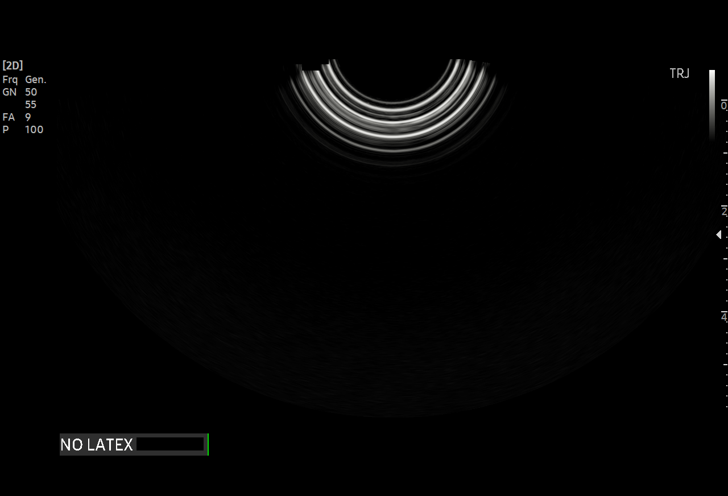
[im 64/91]
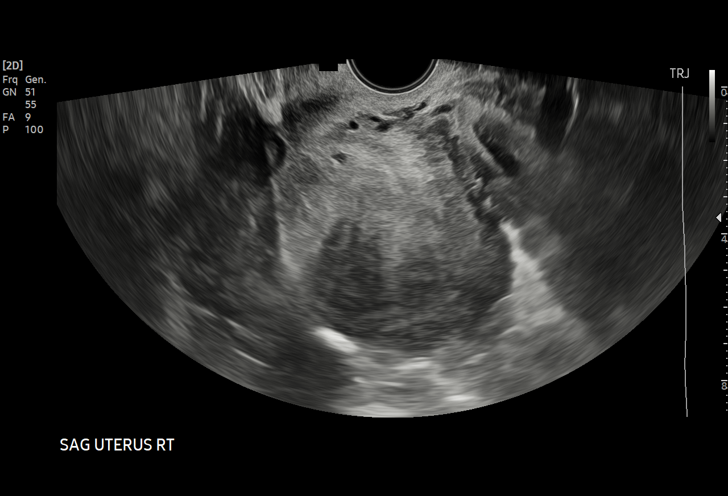
[im 72/91]
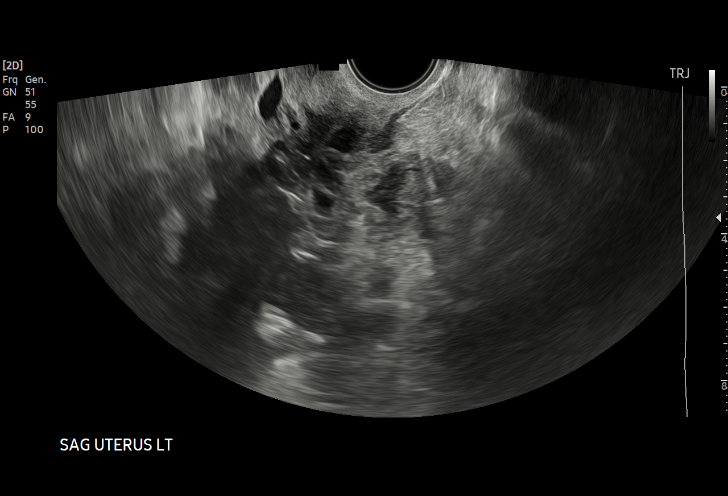
[im 76/91]
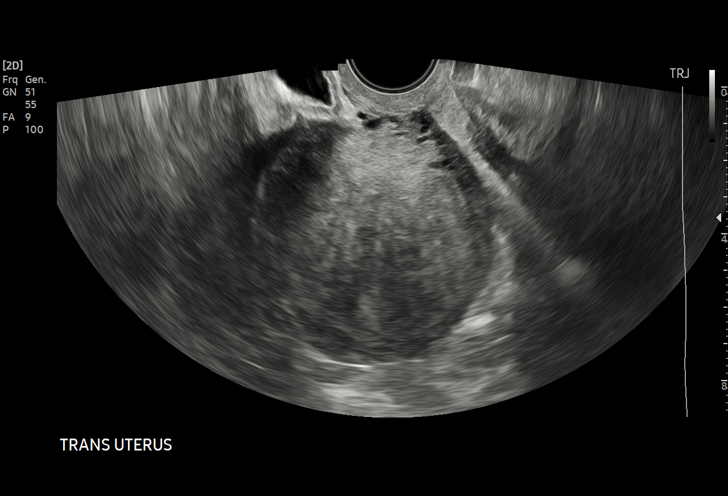
[im 83/91]
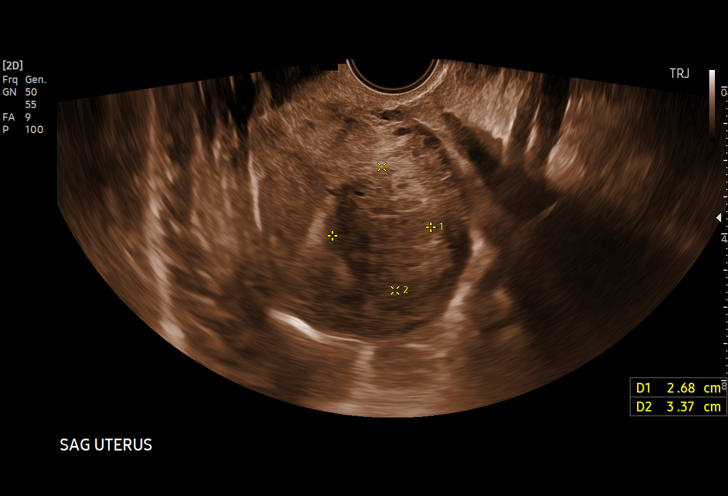
[im 91/91]
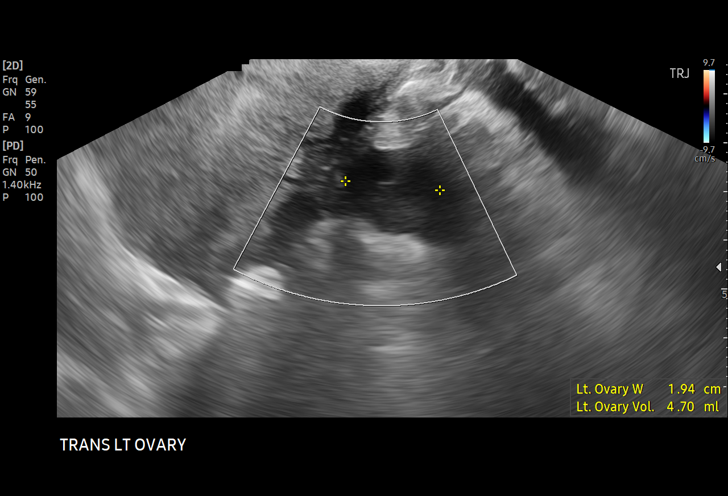

[15 of 25 positions shown; findings below may reference images not displayed]

FINDINGS: Uterus

Measurements: 12.1 x 5.4 x 6.9 cm = volume: 232 mL. Retroverted.
Heterogeneous myometrium. Asymmetric thickening of the posterior
myometrium of the uterus versus anterior. Area of somewhat defined
masslike echogenicity 2.7 x 3.4 x 3.0 cm, associated with areas of
linear shadowing without calcification, question focal adenomyosis
versus subtle leiomyoma, extends submucosal. No additional masses.

Endometrium

Thickness: 8 mm.  No endometrial fluid or focal abnormality

Right ovary

Measurements: 2.7 x 1.3 x 1.6 cm = volume: 2.9 mL. Normal morphology
without mass

Left ovary

Measurements: 2.7 x 1.7 x 1.9 cm = volume: 4.7 mL. Dominant follicle
without mass

Other findings

No free pelvic fluid.  No adnexal masses.
IMPRESSION: Asymmetric thickening of the posterior myometrial wall with
questionable masslike appearance 3.4 x 3.0 x 2.7 cm in size, could
represent focal adenomyosis or a subtle submucosal leiomyoma.

Unremarkable endometrial complex and ovaries.

## 2020-07-29 IMAGING — US US PELVIS COMPLETE
1 series · 13 of 25 positions shown · non-contrast
Comparison: 04/15/2020

CLINICAL DATA: Pelvic pain leg cramping, bleeding

EXAM:
TRANSABDOMINAL AND TRANSVAGINAL ULTRASOUND OF PELVIS
DOPPLER ULTRASOUND OF OVARIES
TECHNIQUE: Both transabdominal and transvaginal ultrasound examinations of the
pelvis were performed. Transabdominal technique was performed for
global imaging of the pelvis including uterus, ovaries, adnexal
regions, and pelvic cul-de-sac.
It was necessary to proceed with endovaginal exam following the
transabdominal exam to visualize the uterus, endometrium, ovaries,
and adnexa. Color and duplex Doppler ultrasound was utilized to
evaluate blood flow to the ovaries.

[Series 1: us pelvis complete · 13 of 75 slices shown]
[im 1/75]
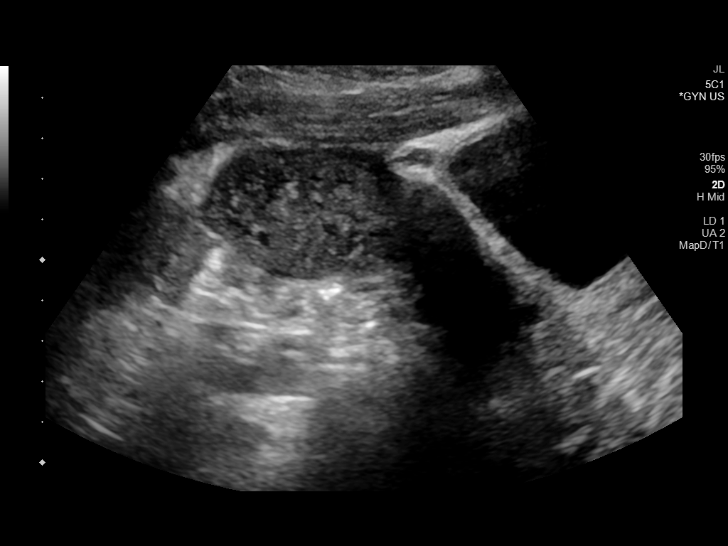
[im 7/75]
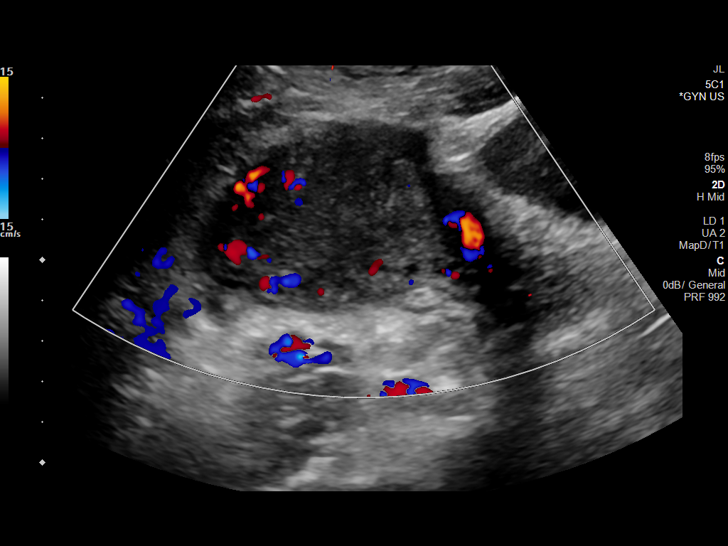
[im 13/75]
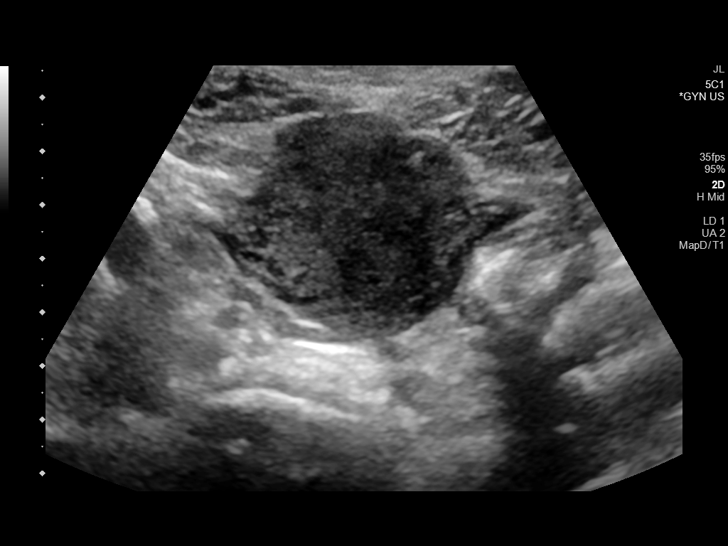
[im 19/75]
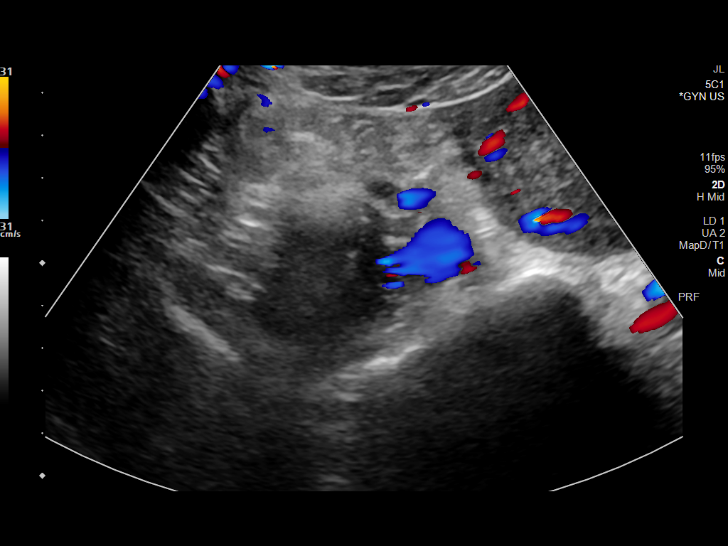
[im 25/75]
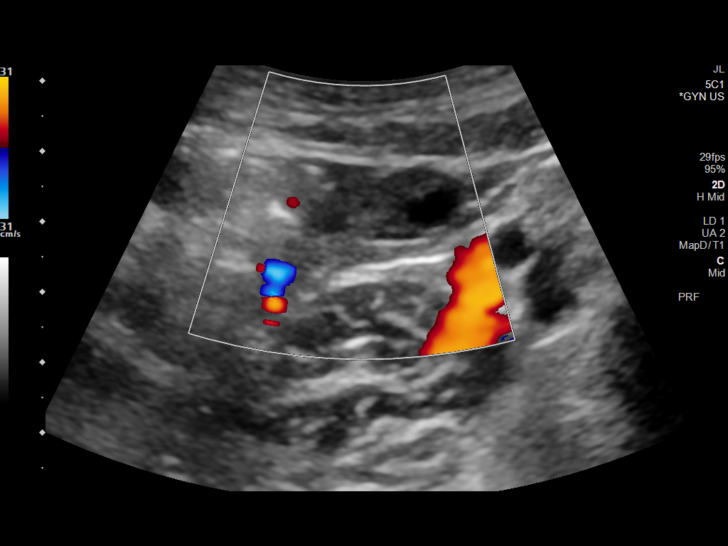
[im 31/75]
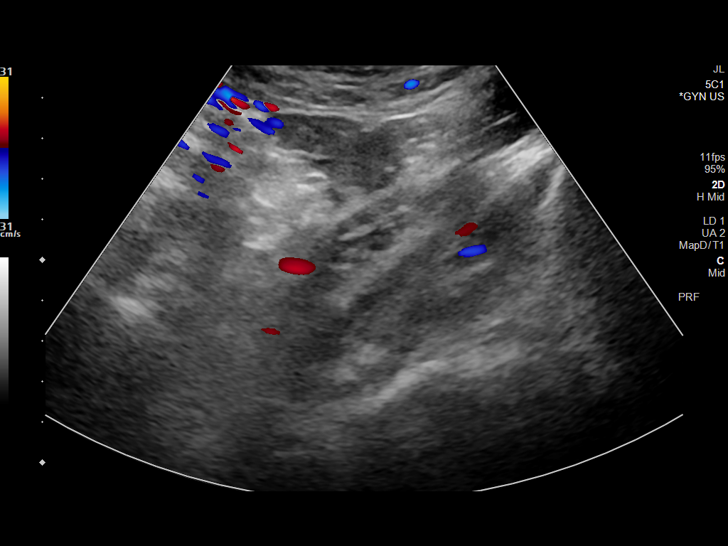
[im 38/75]
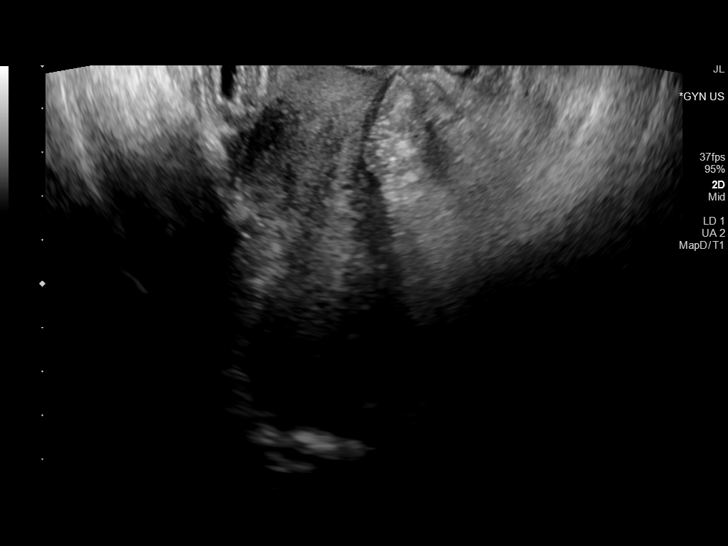
[im 44/75]
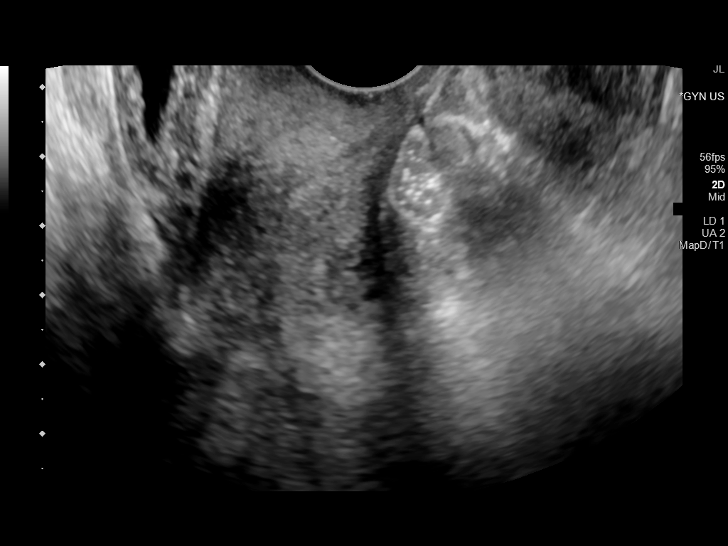
[im 50/75]
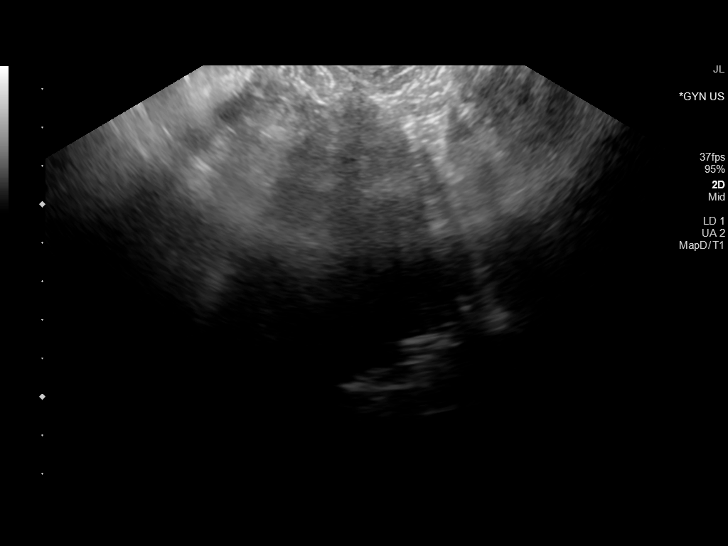
[im 56/75]
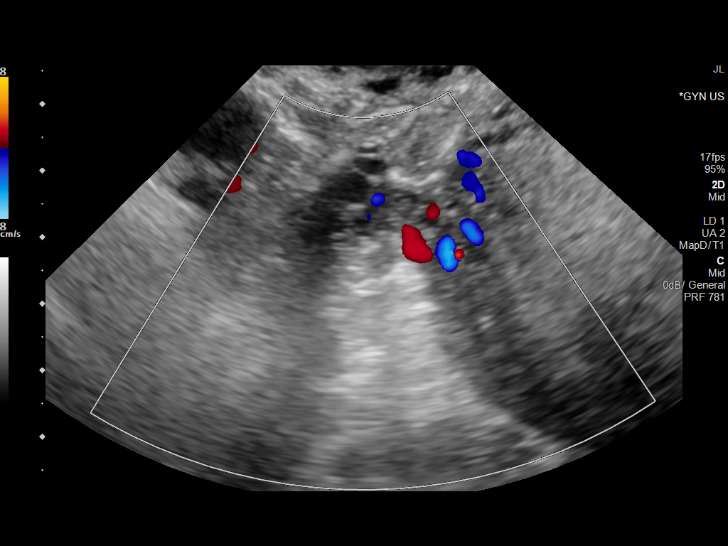
[im 62/75]
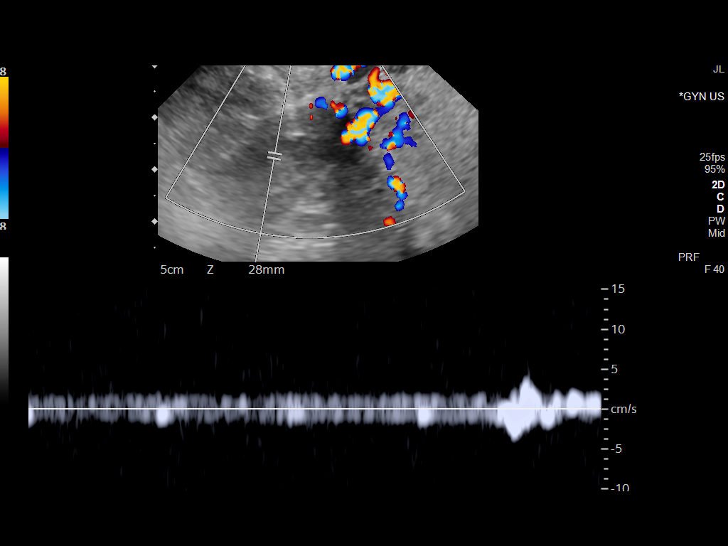
[im 68/75]
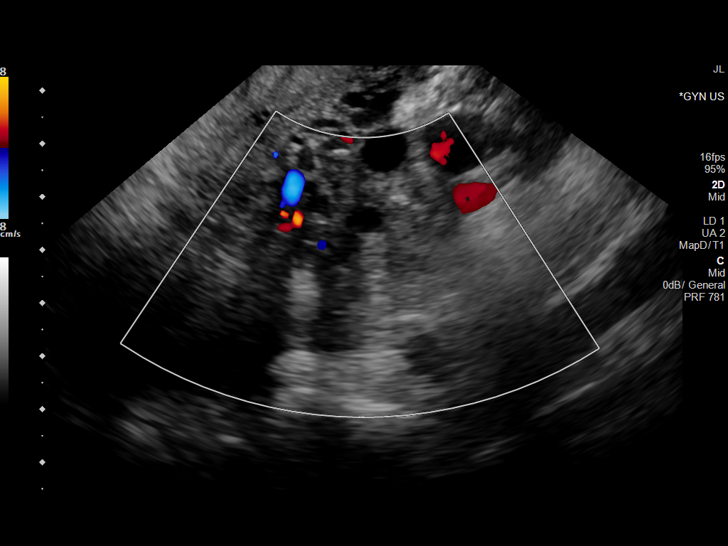
[im 75/75]
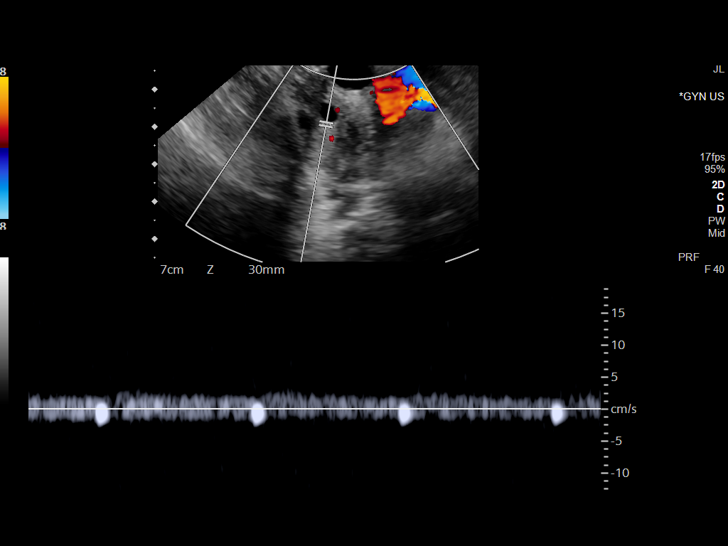

[13 of 25 positions shown; findings below may reference images not displayed]

FINDINGS: Uterus

Measurements: 12.7 x 5.5 x 6.7 cm = volume: 246 mL. Retroflexed
uterus. Heterogeneous, hypoechoic myometrium. No fibroids or other
mass visualized.

Endometrium

Thickness: 4 mm.  No focal abnormality visualized.

Right ovary

Measurements: 2.8 x 1.7 x 2.0 cm = volume: 5 mL. Normal
appearance/no adnexal mass.

Left ovary

Measurements: 2.7 x 1.9 x 1.8 cm = volume: 5 mL. Small follicles.
Normal appearance/no adnexal mass.

Pulsed Doppler evaluation of both ovaries demonstrates normal
low-resistance arterial and venous waveforms.

Other findings

Nabothian cysts in the cervix.  No abnormal free fluid.
IMPRESSION: 1.  No acute ultrasound abnormality of the pelvis to explain pain.

2. Probable uterine adenomyosis, appearance similar to prior
examination.

## 2020-07-29 IMAGING — US US TRANSVAGINAL NON-OB
1 series · 13 of 25 positions shown · non-contrast
Comparison: 04/15/2020

CLINICAL DATA: Pelvic pain leg cramping, bleeding

EXAM:
TRANSABDOMINAL AND TRANSVAGINAL ULTRASOUND OF PELVIS
DOPPLER ULTRASOUND OF OVARIES
TECHNIQUE: Both transabdominal and transvaginal ultrasound examinations of the
pelvis were performed. Transabdominal technique was performed for
global imaging of the pelvis including uterus, ovaries, adnexal
regions, and pelvic cul-de-sac.
It was necessary to proceed with endovaginal exam following the
transabdominal exam to visualize the uterus, endometrium, ovaries,
and adnexa. Color and duplex Doppler ultrasound was utilized to
evaluate blood flow to the ovaries.

[Series 1: us transvaginal non-ob · 13 of 75 slices shown]
[im 1/75]
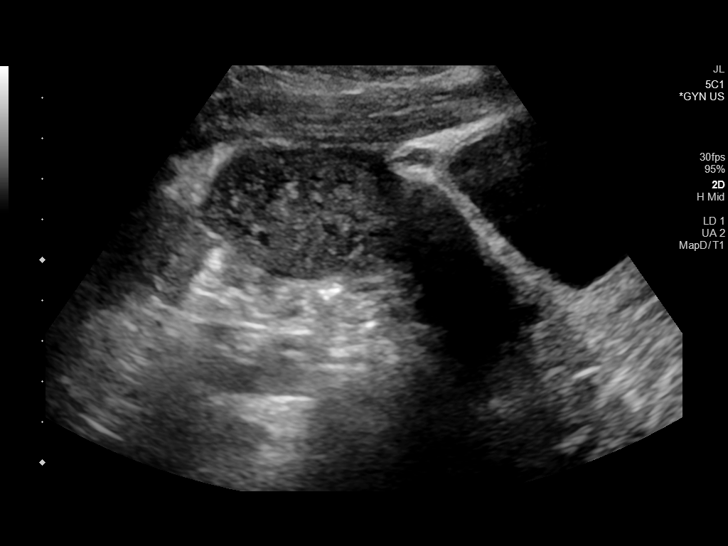
[im 7/75]
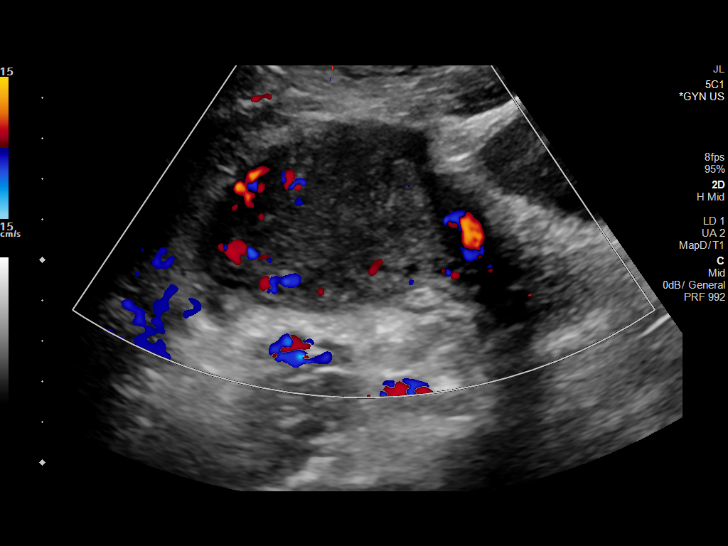
[im 13/75]
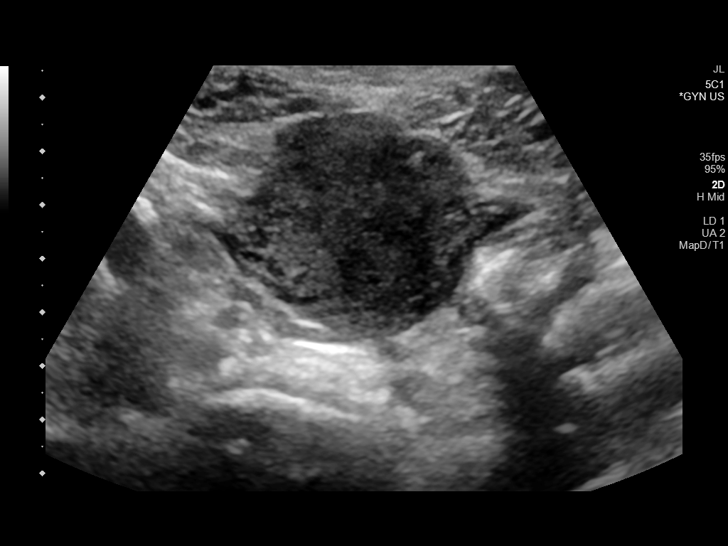
[im 19/75]
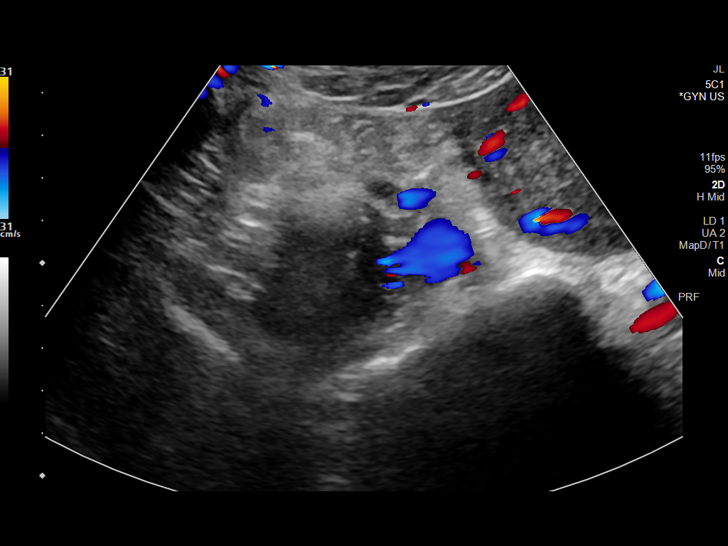
[im 25/75]
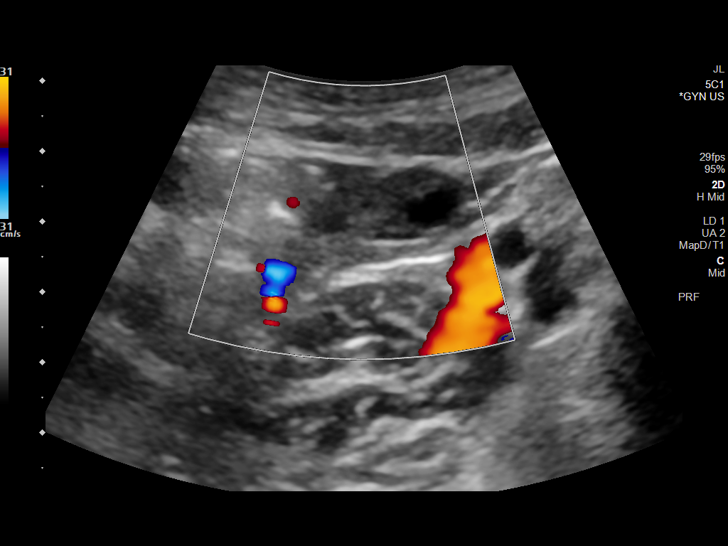
[im 31/75]
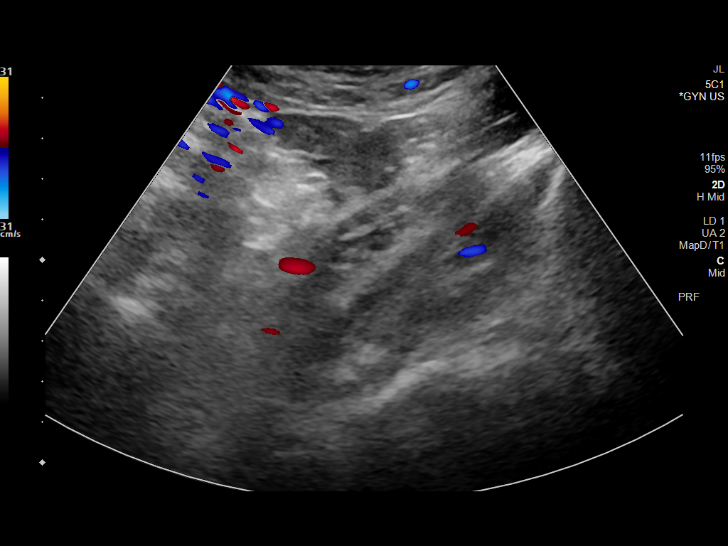
[im 38/75]
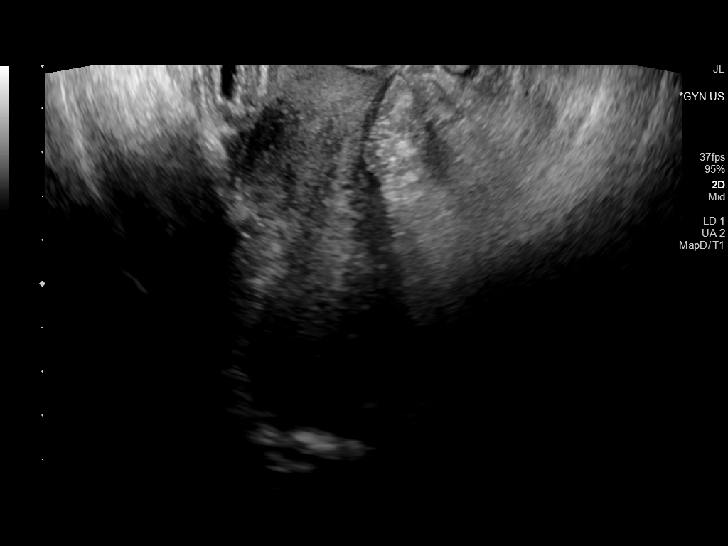
[im 44/75]
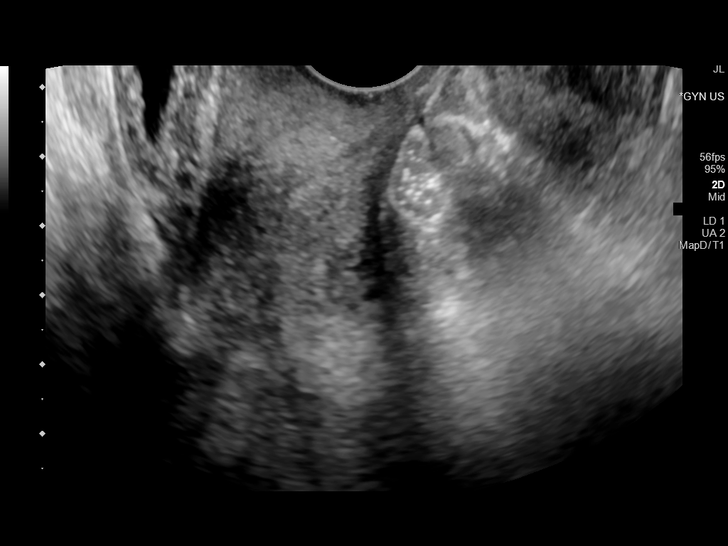
[im 50/75]
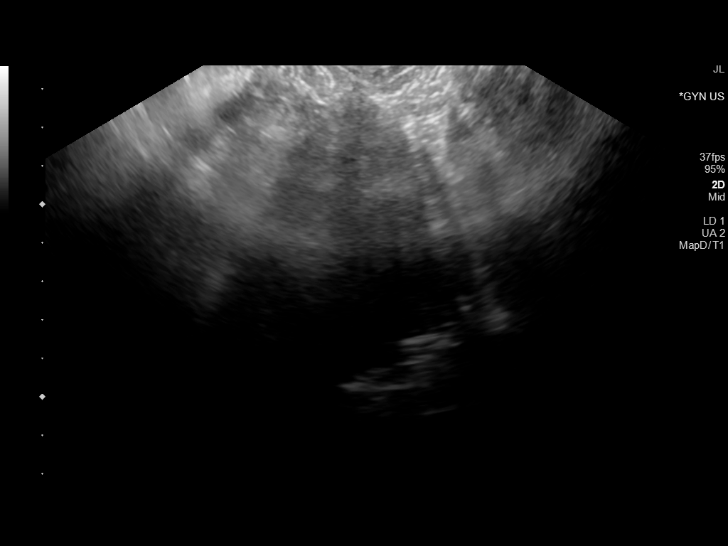
[im 56/75]
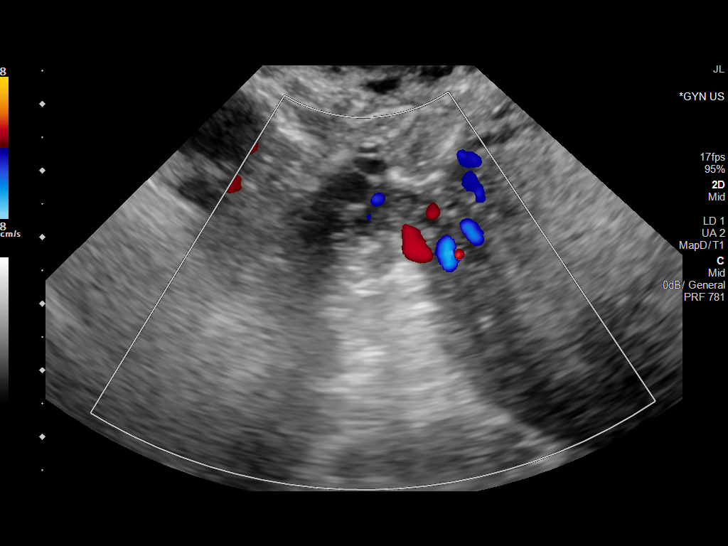
[im 62/75]
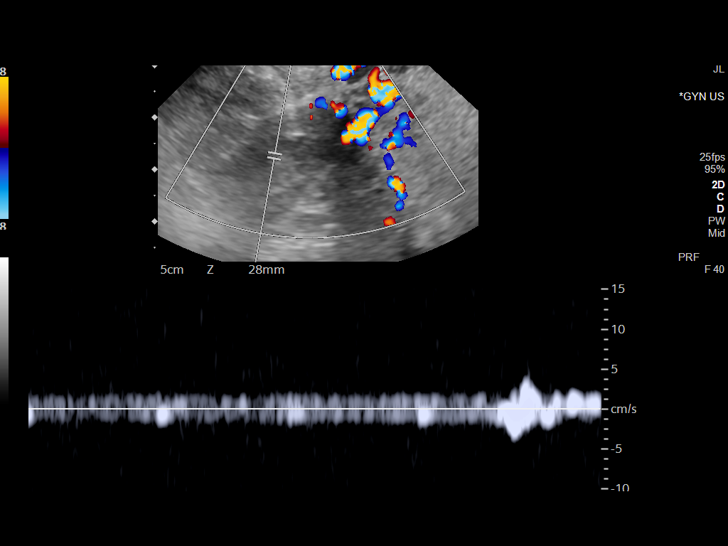
[im 68/75]
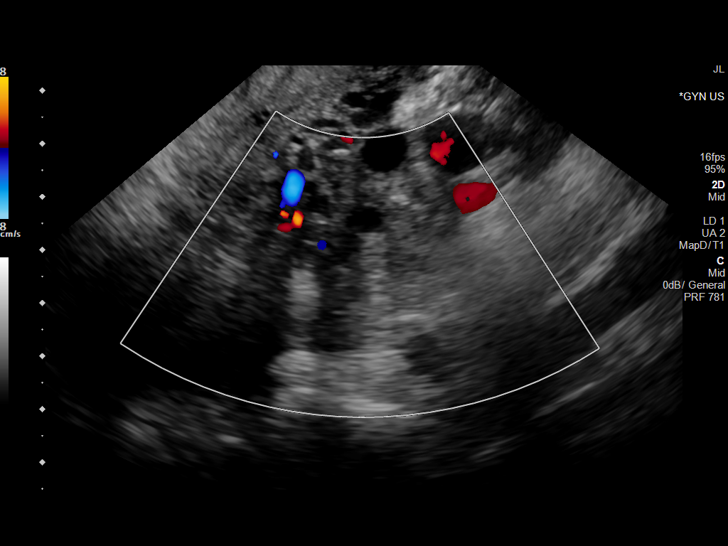
[im 75/75]
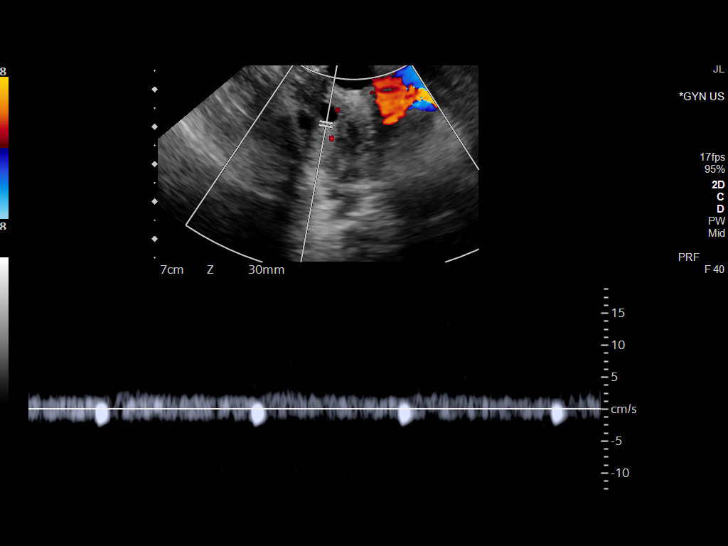

[13 of 25 positions shown; findings below may reference images not displayed]

FINDINGS: Uterus

Measurements: 12.7 x 5.5 x 6.7 cm = volume: 246 mL. Retroflexed
uterus. Heterogeneous, hypoechoic myometrium. No fibroids or other
mass visualized.

Endometrium

Thickness: 4 mm.  No focal abnormality visualized.

Right ovary

Measurements: 2.8 x 1.7 x 2.0 cm = volume: 5 mL. Normal
appearance/no adnexal mass.

Left ovary

Measurements: 2.7 x 1.9 x 1.8 cm = volume: 5 mL. Small follicles.
Normal appearance/no adnexal mass.

Pulsed Doppler evaluation of both ovaries demonstrates normal
low-resistance arterial and venous waveforms.

Other findings

Nabothian cysts in the cervix.  No abnormal free fluid.
IMPRESSION: 1.  No acute ultrasound abnormality of the pelvis to explain pain.

2. Probable uterine adenomyosis, appearance similar to prior
examination.

## 2020-08-13 DIAGNOSIS — Z3202 Encounter for pregnancy test, result negative: Secondary | ICD-10-CM | POA: Diagnosis not present

## 2020-08-13 DIAGNOSIS — N852 Hypertrophy of uterus: Secondary | ICD-10-CM | POA: Diagnosis not present

## 2020-08-13 DIAGNOSIS — N912 Amenorrhea, unspecified: Secondary | ICD-10-CM | POA: Diagnosis not present

## 2020-08-18 ENCOUNTER — Other Ambulatory Visit: Payer: Self-pay | Admitting: Specialist

## 2020-08-18 DIAGNOSIS — R140 Abdominal distension (gaseous): Secondary | ICD-10-CM

## 2020-08-31 ENCOUNTER — Ambulatory Visit
Admission: RE | Admit: 2020-08-31 | Discharge: 2020-08-31 | Disposition: A | Discharge status: Home / Self Care | Payer: Medicaid Other | Source: Ambulatory Visit | Attending: Specialist | Admitting: Specialist

## 2020-08-31 DIAGNOSIS — R109 Unspecified abdominal pain: Secondary | ICD-10-CM | POA: Diagnosis not present

## 2020-08-31 DIAGNOSIS — R102 Pelvic and perineal pain: Secondary | ICD-10-CM | POA: Diagnosis not present

## 2020-08-31 DIAGNOSIS — R140 Abdominal distension (gaseous): Secondary | ICD-10-CM

## 2020-08-31 DIAGNOSIS — R634 Abnormal weight loss: Secondary | ICD-10-CM | POA: Diagnosis not present

## 2020-08-31 MED ORDER — IOPAMIDOL (ISOVUE-300) INJECTION 61%
100.0000 mL | Freq: Once | INTRAVENOUS | Status: AC | PRN
  Administered 2020-08-31: 100 mL via INTRAVENOUS
